# Patient Record
Sex: Male | Born: 1955 | Race: Black or African American | Hispanic: No | Marital: Married | Smoking: Former smoker
Health system: Southern US, Community
[De-identification: ages and names within clinical notes are randomized; demographics above are authoritative.]

## PROBLEM LIST (undated history)

## (undated) DIAGNOSIS — K219 Gastro-esophageal reflux disease without esophagitis: Secondary | ICD-10-CM

## (undated) DIAGNOSIS — E7800 Pure hypercholesterolemia, unspecified: Secondary | ICD-10-CM

## (undated) HISTORY — PX: HERNIA REPAIR: SHX51

## (undated) HISTORY — PX: ROTATOR CUFF REPAIR: SHX139

## (undated) HISTORY — PX: OTHER SURGICAL HISTORY: SHX169

---

## 2001-03-15 ENCOUNTER — Encounter: Payer: Self-pay | Admitting: Emergency Medicine

## 2001-03-15 ENCOUNTER — Emergency Department (HOSPITAL_COMMUNITY): Admission: EM | Admit: 2001-03-15 | Discharge: 2001-03-15 | Payer: Self-pay | Admitting: Emergency Medicine

## 2002-03-28 ENCOUNTER — Encounter: Payer: Self-pay | Admitting: Internal Medicine

## 2002-03-28 ENCOUNTER — Ambulatory Visit (HOSPITAL_COMMUNITY): Admission: RE | Admit: 2002-03-28 | Discharge: 2002-03-28 | Payer: Self-pay | Admitting: Internal Medicine

## 2003-02-20 ENCOUNTER — Encounter: Payer: Self-pay | Admitting: Emergency Medicine

## 2003-02-20 ENCOUNTER — Observation Stay (HOSPITAL_COMMUNITY): Admission: EM | Admit: 2003-02-20 | Discharge: 2003-02-21 | Payer: Self-pay | Admitting: Emergency Medicine

## 2004-03-21 ENCOUNTER — Emergency Department (HOSPITAL_COMMUNITY): Admission: EM | Admit: 2004-03-21 | Discharge: 2004-03-21 | Payer: Self-pay | Admitting: Emergency Medicine

## 2004-07-22 ENCOUNTER — Ambulatory Visit (HOSPITAL_COMMUNITY): Admission: RE | Admit: 2004-07-22 | Discharge: 2004-07-22 | Payer: Self-pay | Admitting: Internal Medicine

## 2004-08-05 ENCOUNTER — Ambulatory Visit (HOSPITAL_COMMUNITY): Admission: RE | Admit: 2004-08-05 | Discharge: 2004-08-05 | Payer: Self-pay | Admitting: Internal Medicine

## 2004-08-21 ENCOUNTER — Ambulatory Visit: Payer: Self-pay | Admitting: Orthopedic Surgery

## 2004-12-03 ENCOUNTER — Ambulatory Visit: Payer: Self-pay | Admitting: Orthopedic Surgery

## 2004-12-16 ENCOUNTER — Ambulatory Visit: Payer: Self-pay | Admitting: Orthopedic Surgery

## 2004-12-16 ENCOUNTER — Ambulatory Visit (HOSPITAL_COMMUNITY): Admission: RE | Admit: 2004-12-16 | Discharge: 2004-12-16 | Payer: Self-pay | Admitting: Orthopedic Surgery

## 2004-12-17 ENCOUNTER — Ambulatory Visit: Payer: Self-pay | Admitting: Orthopedic Surgery

## 2004-12-19 ENCOUNTER — Encounter (HOSPITAL_COMMUNITY): Admission: RE | Admit: 2004-12-19 | Discharge: 2005-01-18 | Payer: Self-pay | Admitting: Orthopedic Surgery

## 2004-12-24 ENCOUNTER — Ambulatory Visit: Payer: Self-pay | Admitting: Orthopedic Surgery

## 2004-12-25 ENCOUNTER — Emergency Department (HOSPITAL_COMMUNITY): Admission: EM | Admit: 2004-12-25 | Discharge: 2004-12-26 | Payer: Self-pay | Admitting: *Deleted

## 2005-01-01 ENCOUNTER — Ambulatory Visit: Payer: Self-pay | Admitting: Orthopedic Surgery

## 2005-01-19 ENCOUNTER — Encounter (HOSPITAL_COMMUNITY): Admission: RE | Admit: 2005-01-19 | Discharge: 2005-02-18 | Payer: Self-pay | Admitting: Orthopedic Surgery

## 2005-02-04 ENCOUNTER — Ambulatory Visit: Payer: Self-pay | Admitting: Orthopedic Surgery

## 2005-02-27 ENCOUNTER — Encounter (HOSPITAL_COMMUNITY): Admission: RE | Admit: 2005-02-27 | Discharge: 2005-03-29 | Payer: Self-pay | Admitting: Orthopedic Surgery

## 2005-03-01 ENCOUNTER — Emergency Department (HOSPITAL_COMMUNITY): Admission: EM | Admit: 2005-03-01 | Discharge: 2005-03-01 | Payer: Self-pay | Admitting: Emergency Medicine

## 2005-03-30 ENCOUNTER — Encounter (HOSPITAL_COMMUNITY): Admission: RE | Admit: 2005-03-30 | Discharge: 2005-04-29 | Payer: Self-pay | Admitting: Orthopedic Surgery

## 2005-03-30 ENCOUNTER — Ambulatory Visit: Payer: Self-pay | Admitting: Orthopedic Surgery

## 2005-07-23 ENCOUNTER — Ambulatory Visit: Payer: Self-pay | Admitting: Orthopedic Surgery

## 2005-09-11 ENCOUNTER — Emergency Department (HOSPITAL_COMMUNITY): Admission: EM | Admit: 2005-09-11 | Discharge: 2005-09-11 | Payer: Self-pay | Admitting: Emergency Medicine

## 2006-06-21 ENCOUNTER — Emergency Department (HOSPITAL_COMMUNITY): Admission: EM | Admit: 2006-06-21 | Discharge: 2006-06-21 | Payer: Self-pay | Admitting: Emergency Medicine

## 2007-01-25 ENCOUNTER — Ambulatory Visit: Payer: Self-pay | Admitting: Gastroenterology

## 2007-02-08 ENCOUNTER — Ambulatory Visit: Payer: Self-pay | Admitting: Gastroenterology

## 2007-02-08 ENCOUNTER — Ambulatory Visit (HOSPITAL_COMMUNITY): Admission: RE | Admit: 2007-02-08 | Discharge: 2007-02-08 | Payer: Self-pay | Admitting: Gastroenterology

## 2007-02-15 ENCOUNTER — Ambulatory Visit (HOSPITAL_COMMUNITY): Admission: RE | Admit: 2007-02-15 | Discharge: 2007-02-15 | Payer: Self-pay | Admitting: Internal Medicine

## 2007-12-05 ENCOUNTER — Ambulatory Visit: Payer: Self-pay | Admitting: Orthopedic Surgery

## 2007-12-05 DIAGNOSIS — M19019 Primary osteoarthritis, unspecified shoulder: Secondary | ICD-10-CM | POA: Insufficient documentation

## 2007-12-05 DIAGNOSIS — M25519 Pain in unspecified shoulder: Secondary | ICD-10-CM | POA: Insufficient documentation

## 2008-01-05 ENCOUNTER — Ambulatory Visit: Payer: Self-pay | Admitting: Orthopedic Surgery

## 2008-01-19 ENCOUNTER — Ambulatory Visit: Payer: Self-pay | Admitting: Orthopedic Surgery

## 2008-04-18 ENCOUNTER — Encounter: Payer: Self-pay | Admitting: Orthopedic Surgery

## 2008-04-23 ENCOUNTER — Ambulatory Visit: Payer: Self-pay | Admitting: Orthopedic Surgery

## 2008-05-02 ENCOUNTER — Encounter: Payer: Self-pay | Admitting: Orthopedic Surgery

## 2008-05-04 ENCOUNTER — Ambulatory Visit (HOSPITAL_COMMUNITY): Admission: RE | Admit: 2008-05-04 | Discharge: 2008-05-04 | Payer: Self-pay | Admitting: Orthopedic Surgery

## 2008-05-04 ENCOUNTER — Encounter: Payer: Self-pay | Admitting: Orthopedic Surgery

## 2008-05-04 ENCOUNTER — Ambulatory Visit: Payer: Self-pay | Admitting: Orthopedic Surgery

## 2008-05-07 ENCOUNTER — Ambulatory Visit: Payer: Self-pay | Admitting: Orthopedic Surgery

## 2008-05-09 ENCOUNTER — Encounter (HOSPITAL_COMMUNITY): Admission: RE | Admit: 2008-05-09 | Discharge: 2008-06-08 | Payer: Self-pay | Admitting: Orthopedic Surgery

## 2008-05-15 ENCOUNTER — Ambulatory Visit: Payer: Self-pay | Admitting: Orthopedic Surgery

## 2008-05-23 ENCOUNTER — Encounter: Payer: Self-pay | Admitting: Orthopedic Surgery

## 2008-06-07 ENCOUNTER — Encounter: Payer: Self-pay | Admitting: Orthopedic Surgery

## 2008-07-18 ENCOUNTER — Ambulatory Visit: Payer: Self-pay | Admitting: Orthopedic Surgery

## 2008-12-17 ENCOUNTER — Ambulatory Visit: Payer: Self-pay | Admitting: Orthopedic Surgery

## 2008-12-24 ENCOUNTER — Encounter: Payer: Self-pay | Admitting: Orthopedic Surgery

## 2008-12-24 ENCOUNTER — Ambulatory Visit (HOSPITAL_COMMUNITY): Admission: RE | Admit: 2008-12-24 | Discharge: 2008-12-24 | Payer: Self-pay | Admitting: Internal Medicine

## 2008-12-27 ENCOUNTER — Telehealth: Payer: Self-pay | Admitting: Orthopedic Surgery

## 2009-01-07 ENCOUNTER — Ambulatory Visit: Payer: Self-pay | Admitting: Orthopedic Surgery

## 2009-01-07 DIAGNOSIS — M75120 Complete rotator cuff tear or rupture of unspecified shoulder, not specified as traumatic: Secondary | ICD-10-CM

## 2009-01-09 ENCOUNTER — Encounter: Payer: Self-pay | Admitting: Orthopedic Surgery

## 2009-02-06 ENCOUNTER — Emergency Department (HOSPITAL_COMMUNITY): Admission: EM | Admit: 2009-02-06 | Discharge: 2009-02-06 | Payer: Self-pay | Admitting: Emergency Medicine

## 2009-02-13 ENCOUNTER — Encounter: Payer: Self-pay | Admitting: Orthopedic Surgery

## 2009-02-14 ENCOUNTER — Encounter: Payer: Self-pay | Admitting: Orthopedic Surgery

## 2009-02-15 ENCOUNTER — Ambulatory Visit: Payer: Self-pay | Admitting: Orthopedic Surgery

## 2009-02-15 ENCOUNTER — Ambulatory Visit (HOSPITAL_COMMUNITY): Admission: RE | Admit: 2009-02-15 | Discharge: 2009-02-15 | Payer: Self-pay | Admitting: Orthopedic Surgery

## 2009-02-19 ENCOUNTER — Ambulatory Visit: Payer: Self-pay | Admitting: Orthopedic Surgery

## 2009-02-20 ENCOUNTER — Encounter: Payer: Self-pay | Admitting: Orthopedic Surgery

## 2009-02-20 ENCOUNTER — Encounter (HOSPITAL_COMMUNITY): Admission: RE | Admit: 2009-02-20 | Discharge: 2009-03-27 | Payer: Self-pay | Admitting: Orthopedic Surgery

## 2009-02-25 ENCOUNTER — Ambulatory Visit: Payer: Self-pay | Admitting: Orthopedic Surgery

## 2009-02-26 ENCOUNTER — Ambulatory Visit: Payer: Self-pay | Admitting: Orthopedic Surgery

## 2009-02-28 ENCOUNTER — Encounter: Payer: Self-pay | Admitting: Orthopedic Surgery

## 2009-02-28 ENCOUNTER — Encounter (INDEPENDENT_AMBULATORY_CARE_PROVIDER_SITE_OTHER): Payer: Self-pay | Admitting: *Deleted

## 2009-03-14 ENCOUNTER — Encounter: Payer: Self-pay | Admitting: Orthopedic Surgery

## 2009-03-20 ENCOUNTER — Encounter: Payer: Self-pay | Admitting: Orthopedic Surgery

## 2009-03-27 ENCOUNTER — Encounter (INDEPENDENT_AMBULATORY_CARE_PROVIDER_SITE_OTHER): Payer: Self-pay | Admitting: *Deleted

## 2009-04-01 ENCOUNTER — Ambulatory Visit: Payer: Self-pay | Admitting: Orthopedic Surgery

## 2009-04-02 ENCOUNTER — Encounter (HOSPITAL_COMMUNITY): Admission: RE | Admit: 2009-04-02 | Discharge: 2009-05-02 | Payer: Self-pay | Admitting: Orthopedic Surgery

## 2009-04-03 ENCOUNTER — Encounter: Payer: Self-pay | Admitting: Orthopedic Surgery

## 2009-04-17 ENCOUNTER — Telehealth: Payer: Self-pay | Admitting: Orthopedic Surgery

## 2009-04-17 ENCOUNTER — Encounter: Payer: Self-pay | Admitting: Orthopedic Surgery

## 2009-04-26 ENCOUNTER — Encounter: Payer: Self-pay | Admitting: Orthopedic Surgery

## 2009-05-03 ENCOUNTER — Encounter (INDEPENDENT_AMBULATORY_CARE_PROVIDER_SITE_OTHER): Payer: Self-pay | Admitting: *Deleted

## 2009-05-06 ENCOUNTER — Encounter: Payer: Self-pay | Admitting: Orthopedic Surgery

## 2009-05-07 ENCOUNTER — Ambulatory Visit: Payer: Self-pay | Admitting: Orthopedic Surgery

## 2009-05-07 ENCOUNTER — Encounter (INDEPENDENT_AMBULATORY_CARE_PROVIDER_SITE_OTHER): Payer: Self-pay | Admitting: *Deleted

## 2009-05-15 ENCOUNTER — Encounter (INDEPENDENT_AMBULATORY_CARE_PROVIDER_SITE_OTHER): Payer: Self-pay | Admitting: *Deleted

## 2009-05-15 ENCOUNTER — Telehealth: Payer: Self-pay | Admitting: Orthopedic Surgery

## 2009-05-22 ENCOUNTER — Encounter: Payer: Self-pay | Admitting: Orthopedic Surgery

## 2009-05-27 ENCOUNTER — Encounter: Payer: Self-pay | Admitting: Orthopedic Surgery

## 2009-05-29 ENCOUNTER — Encounter: Payer: Self-pay | Admitting: Orthopedic Surgery

## 2009-06-03 ENCOUNTER — Telehealth: Payer: Self-pay | Admitting: Orthopedic Surgery

## 2009-06-08 ENCOUNTER — Emergency Department (HOSPITAL_COMMUNITY): Admission: EM | Admit: 2009-06-08 | Discharge: 2009-06-08 | Payer: Self-pay | Admitting: Emergency Medicine

## 2009-06-26 ENCOUNTER — Encounter: Payer: Self-pay | Admitting: Orthopedic Surgery

## 2009-07-25 ENCOUNTER — Telehealth: Payer: Self-pay | Admitting: Orthopedic Surgery

## 2009-07-30 ENCOUNTER — Ambulatory Visit: Payer: Self-pay | Admitting: Orthopedic Surgery

## 2009-08-05 ENCOUNTER — Encounter: Payer: Self-pay | Admitting: Orthopedic Surgery

## 2009-10-07 ENCOUNTER — Telehealth: Payer: Self-pay | Admitting: Orthopedic Surgery

## 2009-12-11 ENCOUNTER — Emergency Department (HOSPITAL_COMMUNITY): Admission: EM | Admit: 2009-12-11 | Discharge: 2009-12-11 | Payer: Self-pay | Admitting: Emergency Medicine

## 2009-12-16 ENCOUNTER — Telehealth: Payer: Self-pay | Admitting: Orthopedic Surgery

## 2010-05-07 ENCOUNTER — Encounter: Admission: RE | Admit: 2010-05-07 | Discharge: 2010-05-07 | Payer: Self-pay | Admitting: Urology

## 2010-07-14 ENCOUNTER — Ambulatory Visit (HOSPITAL_COMMUNITY): Admission: RE | Admit: 2010-07-14 | Discharge: 2010-07-14 | Payer: Self-pay | Admitting: Urology

## 2010-11-18 NOTE — Progress Notes (Signed)
Summary: Norco 5 request  Phone Note Call from Patient Call back at [facility] Phone [phone]   Summary of Call: wants refill on Norco 5, patient has not been seen since 07/30/09, and was told as needed, ok or not Initial call taken by: Ether Griffins,  December 16, 2009 11:03 AM  Follow-up for Phone Call        not Follow-up by: Fuller Canada MD,  December 16, 2009 11:58 AM

## 2011-01-01 LAB — BASIC METABOLIC PANEL
CO2: 27 mEq/L (ref 19–32)
Calcium: 9.2 mg/dL (ref 8.4–10.5)
Creatinine, Ser: 1.02 mg/dL (ref 0.4–1.5)
GFR calc non Af Amer: >60 mL/min (ref >60)

## 2011-01-01 LAB — HEMOGLOBIN AND HEMATOCRIT, BLOOD
HCT: 38.6 % — ABNORMAL LOW (ref 39.0–52.0)
Hemoglobin: 13.2 g/dL (ref 13.0–17.0)

## 2011-01-01 LAB — SURGICAL PCR SCREEN: MRSA, PCR: NEGATIVE

## 2011-01-24 LAB — BASIC METABOLIC PANEL
BUN: 11 mg/dL (ref 6–23)
CO2: 32 mEq/L (ref 19–32)
Calcium: 9.4 mg/dL (ref 8.4–10.5)
Chloride: 104 mEq/L (ref 96–112)
Creatinine, Ser: 0.97 mg/dL (ref 0.4–1.5)
GFR calc non Af Amer: >60 mL/min (ref >60)

## 2011-01-24 LAB — DIFFERENTIAL
Eosinophils Absolute: 0.2 10*3/uL (ref 0.0–0.7)
Eosinophils Relative: 5 % (ref 0–5)
Lymphocytes Relative: 34 % (ref 12–46)
Lymphs Abs: 1.5 10*3/uL (ref 0.7–4.0)
Monocytes Relative: 11 % (ref 3–12)
Neutro Abs: 2.2 10*3/uL (ref 1.7–7.7)
Neutrophils Relative %: 50 % (ref 43–77)

## 2011-01-24 LAB — CBC
HCT: 39.8 % (ref 39.0–52.0)
Hemoglobin: 13.7 g/dL (ref 13.0–17.0)
MCV: 86.5 fL (ref 78.0–100.0)
Platelets: 123 10*3/uL — ABNORMAL LOW (ref 150–400)
WBC: 4.4 10*3/uL (ref 4.0–10.5)

## 2011-01-28 LAB — BASIC METABOLIC PANEL
BUN: 12 mg/dL (ref 6–23)
Creatinine, Ser: 0.96 mg/dL (ref 0.4–1.5)
GFR calc Af Amer: >60 mL/min (ref >60)
GFR calc non Af Amer: >60 mL/min (ref >60)

## 2011-01-28 LAB — HEMOGLOBIN AND HEMATOCRIT, BLOOD
HCT: 39 % (ref 39.0–52.0)
Hemoglobin: 13.4 g/dL (ref 13.0–17.0)

## 2011-01-31 ENCOUNTER — Ambulatory Visit (HOSPITAL_COMMUNITY)
Admission: RE | Admit: 2011-01-31 | Payer: Managed Care, Other (non HMO) | Source: Ambulatory Visit | Admitting: Internal Medicine

## 2011-01-31 ENCOUNTER — Other Ambulatory Visit (HOSPITAL_COMMUNITY): Payer: Self-pay | Admitting: Internal Medicine

## 2011-01-31 ENCOUNTER — Ambulatory Visit (HOSPITAL_COMMUNITY)
Admission: RE | Admit: 2011-01-31 | Discharge: 2011-01-31 | Disposition: A | Discharge status: Home / Self Care | Payer: Managed Care, Other (non HMO) | Source: Ambulatory Visit | Attending: Internal Medicine | Admitting: Internal Medicine

## 2011-01-31 DIAGNOSIS — M25559 Pain in unspecified hip: Secondary | ICD-10-CM | POA: Insufficient documentation

## 2011-01-31 DIAGNOSIS — M545 Low back pain, unspecified: Secondary | ICD-10-CM | POA: Insufficient documentation

## 2011-01-31 DIAGNOSIS — M5380 Other specified dorsopathies, site unspecified: Secondary | ICD-10-CM | POA: Insufficient documentation

## 2011-01-31 DIAGNOSIS — M549 Dorsalgia, unspecified: Secondary | ICD-10-CM

## 2011-03-03 NOTE — Op Note (Signed)
NAMECAMP, GOPAL                 ACCOUNT NO.:  [id number]   MEDICAL RECORD NO.:  [id number]          PATIENT TYPE:  AMB   LOCATION:  DAY                           FACILITY:  [facility]   PHYSICIAN:  Vickki Hearing, M.D.DATE OF BIRTH:  1956-08-20   DATE OF PROCEDURE:  DATE OF DISCHARGE:                               OPERATIVE REPORT   Angel Powell is a 55 year old male with right shoulder pain status post open  Mumford in July 2009.  He did well but continued to have discomfort in  the right shoulder which progressed over time, it actually increased.  He was given an injection and pain medication and his symptoms did not  improve.  He went for an MRI which showed a subscapularis bursal-sided  partial-thickness tear, a full-thickness partial width supraspinatus  tendon tear, deformity of the humeral head consistent with a Hill-Sachs  lesion, and a glenoid flap tear at 3 o'clock position.   After discussion, he decided to have surgery on the shoulder for  arthroscopy with open mini rotator cuff repair.   PREOPERATIVE DIAGNOSIS:  Right rotator cuff tear.   POSTOPERATIVE DIAGNOSIS:  Right rotator cuff tear.   PROCEDURE:  Arthroscopy of the right shoulder, debridement of the  glenohumeral joint, debridement of the subscapularis tendon, and repair  of the right rotator cuff.  We used a 4.5 pushed lock anchor and two 5.0  corkscrew anchors all with FiberWire.   SURGEON:  Vickki Hearing, MD   ASSISTED BY:  Birdie Riddle.   ANESTHESIA:  General.   FINDINGS:  The subscapularis had a superior leading edge tear.  The  supraspinatus was torn.  The teres minor was torn.  The biceps had  tendonitis, and there was synovitis of the glenohumeral joint.  There  were no specimens.   The patient was identified in the preop area, site marking was  performed.  The patient was taken to surgery, given Ancef.  He had  general anesthesia.  He was placed in the modified beach-chair position.  He was  prepped with DuraPrep, draped sterilely, and time-out was  completed.   Posterior portal was established.  A diagnostic arthroscopy was  performed.  Through an anterior portal, we did a debridement of the  subscapularis and glenohumeral joint.  The biceps anchor was intact with  tendonitis.   The cuff tear was identified, and a scope was then placed in the  subacromial space.   An anterolateral portal was established.  The cuff was debrided and  mobilization procedures were performed to mobilize the cuff until it  reached the greater tuberosity.  The greater tuberosity was decorticated  with a bur.  No acromioplasty was performed as the patient had adequate  room in his subacromial space.   A lateral portal was established, and several sutures were using  FiberWire were passed through the cuff and brought out through the  anterolateral portal.   We then opened the shoulder through the anterolateral portal extending  the incision proximally and distally, given the deltoid muscle attached.  We did deltoid split, brought the sutures out through  the anterolateral  portal, and then passed the sutures into the PushLock and punched the  hole for the PushLock and advanced the PushLock down into bone which  secured the supraspinatus tendon.  We then took 2 suture anchors and  placed them in the greater tuberosity and repaired the posterior edge of  the supraspinatus as well as the teres minor.  We got an excellent  repair with no tension.  Wound was irrigated, closed in layered fashion  with 2-0 Monocryl, staples, and we injected a total 60 mL of Marcaine in  the subcu and subacromial space.  The patient was placed in a Cryo/Cuff  and with extubated, taken to recovery room in stable condition.   Postop plan is for immediate passive range of motion 0-90 in abduction  and flexion with a goal of 90 degrees by 2 weeks.  He will be discharged  home on the following medications:  1. Phenergan  25 mg,  2. Percocet 5 mg.  3. Robaxin 500 mg.   He will have 90 Percocet, 40 Phenergan, and 60 Robaxin.      Vickki Hearing, M.D.  Electronically Signed     SEH/MEDQ  D:  02/15/2009  T:  02/16/2009  Job:  [id number]

## 2011-03-03 NOTE — Op Note (Signed)
NAMELAREN, WHALING                 ACCOUNT NO.:  [id number]   MEDICAL RECORD NO.:  [id number]          PATIENT TYPE:  AMB   LOCATION:  DAY                           FACILITY:  [facility]   PHYSICIAN:  Vickki Hearing, M.D.DATE OF BIRTH:  25-Nov-1955   DATE OF PROCEDURE:  05/04/2008  DATE OF DISCHARGE:                               OPERATIVE REPORT   This is a 55 year old male with a history of right shoulder pain.  Radiographs showed a spur coming from the distal clavicle.  His pain  level was 8 out of 10, he had primarily pain reaching across his chest.  He denied radicular pain or neurologic symptoms.  His rotator cuff  clinically was intact and his impingement sign was unremarkable.   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   PROCEDURE:  Open Mumford/distal clavicle excision.   SURGEON:  Vickki Hearing, MD.   ANESTHETIC:  General.   OPERATIVE FINDINGS:  Degeneration of the [facility] joint with large impinging  undersurface spur.   SPECIMEN:  Distal clavicle.   ASSISTED BY:  Valetta Close.   DETAILS:  The patient was identified as Angel Powell.  His right arm was  marked for surgery and I countersigned it.  I reviewed his history and  physical, it was updated and the update was signed.  The patient was  given antibiotics, taken to the operating room for general anesthetic.  He was placed in a modified beach-chair position with a sandbag under  his shoulder his shoulder and arm were prepped with DuraPrep, draped  sterilely.  A time-out was completed.   A strap incision was made in Langer's line over the distal clavicle.  Subcutaneous tissue was divided, periosteum was split longitudinally  with the clavicle and subperiosteal dissection exposed the joint which  was degenerated.  The distal centimeter was removed and then the  posterior  edge was beveled back.  The arm was taken across the chest and  there was no further impinging of the collar bone with clavicle.  The  undersurface was smoothed with a rasp.  The wound was irrigated,  hemostasis was obtained.  Bone wax was applied to the distal end of the  clavicle and #0 Monocryl in two layers was used to close the skin along  with a running nylon.  We injected the subacromial space with 30 mL of  Marcaine with epinephrine, applied sterile dressings and Cryo/Cuff.  He  was extubated, taken to  recovery room in stable condition.  Postop plan is for the patient to  return to [place] next week to check the wound and make a referral for  physical therapy.  He is discharged on Lorcet +60 with two refills and  Phenergan 25 mg q.6 p.r.n. nausea with no refills.      Vickki Hearing, M.D.  Electronically Signed     SEH/MEDQ  D:  05/04/2008  T:  05/04/2008  Job:  [id number]

## 2011-03-06 NOTE — H&P (Signed)
NAME:  Angel Powell, Angel Powell                 ACCOUNT NO.:  [id number]   MEDICAL RECORD NO.:  [id number]          PATIENT TYPE:  AMB   LOCATION:  DAY                           FACILITY:  [facility]   PHYSICIAN:  Vickki Hearing, M.D.DATE OF BIRTH:  Apr 15, 1956   DATE OF ADMISSION:  DATE OF DISCHARGE:  LH                                HISTORY & PHYSICAL   CHIEF COMPLAINT:  Left shoulder pain.   HISTORY OF PRESENT ILLNESS:  This 55 year old male presented with pain in  the left shoulder, no known injury.  He had pain over a two-year period,  worse over the last six months.  It was associated with forward elevation,  and he remembered lifting weights and feeling a pop in his shoulder.  Although he got better, he still had pain with overhead activity.  He is an  avid weight lifter.  He describes aching, constant pain, with a history of  bench pressing trauma.  Modifying factors: overhead activity.  Related signs  or symptoms:  None.  MRI shows torn supraspinatus tendon with no retraction.   REVIEW OF SYSTEMS:  Positive systems ear, nose, throat, poor vision,  toothache, otherwise normal.   PHYSICAL EXAMINATION:  VITAL SIGNS:  Weight 250, pulse 76, respiratory rate  18.  GENERAL:  Muscular.  Normal development, grooming, hygiene..  No deformity.  CARDIOVASCULAR:  Normal pulses and perfusion.  CERVICAL SPINE:  No lymph nodes.  Gait and station were normal.  MUSCULOSKELETAL:  Left shoulder weakness was not present in the rotator  cuff.  There was full range of motion with pain with overhead activity,  positive impingement sign.  Shoulder was stable.  He was very muscular.  I  think his deltoid was compensating for the rotator cuff.  EXTREMITIES:  Lower extremities are well aligned without contracture,  dislocation, atrophy, or tremor.  Right upper extremity showed normal range  of motion, strength, and stability without swelling.  SKIN:  Normal.  NEUROPSYCHIATRY:  Exam showed normal sensation and  reflexes.  Psych exam  showed alert and oriented x 3 with pleasant mood.   IMPRESSION:  Again, his MRI does show a torn rotator cuff full-thickness  tear, no retraction, distal supraspinatus tendon.   PLAN:  Arthroscopy left shoulder medial rotator cuff repair.     SEH/MEDQ  D:  12/15/2004  T:  12/15/2004  Job:  [id number]

## 2011-03-06 NOTE — Procedures (Signed)
NAMEBERTRAN, ZEIMET                             ACCOUNT NO.:  [id number]   MEDICAL RECORD NO.:  [id number]                   PATIENT TYPE:   LOCATION:                                       FACILITY:   PHYSICIAN:  Kem Boroughs, M.D.                 DATE OF BIRTH:   DATE OF PROCEDURE:  02/21/2003  DATE OF DISCHARGE:                                  ECHOCARDIOGRAM   PROCEDURE:  Echocardiogram   SURGEON:  Sherral Hammers, M.D.   REFERRING PHYSICIAN:  Tesfaye D. Felecia Shelling, M.D. and Kem Boroughs, M.D.   INDICATIONS:  This patient is a 55 year old gentleman who is admitted with  chest pain.   TECHNICAL QUALITY:  The technical quality of this study was adequate.   FINDINGS:  1. The aorta is within normal limits at 4.0 cm.  2. The left atrium is within normal limits at 3.4 cm.  No obvious clots or     masses were appreciated.  The patient appeared to be in sinus rhythm     during this procedure.  3. The interventricular septum and posterior wall were thickened consistent     with mild-to-moderate concentric LVH.  4. The aortic valve appeared reasonably thin, trileaflet and pliable with     good leaflet excursion.  No aortic insufficiency was noted.  The proximal     aortic root was visualized and no obvious linear densities were noted.  5. The mitral valve was also reasonably thin and pliable with good leaflet     excursion.  No mitral valve prolapse was noted.  No mitral annular     calcification was noted.  Trivial mitral regurgitation was noted.  6. The pulmonic valve was incompletely visualized, but appeared grossly     structurally normal with mild pulmonic insufficiency noted.  7. The tricuspid valve was also grossly structurally normal.  8. The left ventricular was normal in size with the LVIBD measuring 4.3 cm     and the LVISD measured 3.0 cm.  Overall left ventricular systolic     function was normal and no regional wall motion abnormalities were noted.     There is no  evidence for diastolic dysfunction.  The right atrium and the     right ventricle are normal in size with normal right ventricular systolic     function.  No pericardial effusion is noted.   IMPRESSION:  1. Mild-to-moderate concentric left ventricular hypertrophy.  2. Normal left ventricular chamber size and systolic function.  3.     Trivial mitral regurgitation.  4. Mild pulmonic insufficiency.  5. No mitral valve prolapse.                                               Tori  Domingo Sep, M.D.    TB/MEDQ  D:  02/21/2003  T:  02/21/2003  Job:  [id number]   cc:   Tesfaye D. Felecia Shelling, M.D.  [address]  [place]  [place] [address]  Fax: [phone]

## 2011-03-06 NOTE — H&P (Signed)
NAME:  Angel Powell, Angel Powell                             ACCOUNT NO.:  [id number]   MEDICAL RECORD NO.:  [id number]                   PATIENT TYPE:  INP   LOCATION:  A202                                 FACILITY:  [facility]   PHYSICIAN:  Tesfaye D. Felecia Shelling, M.D.              DATE OF BIRTH:  [birth year]   DATE OF ADMISSION:  02/20/2003  DATE OF DISCHARGE:                                HISTORY & PHYSICAL   CHIEF COMPLAINT:  Chest pain.   HISTORY OF PRESENT ILLNESS:  This is a 55 year old male patient with history  of hypercholesterolemia. He came to the emergency room with complaint of  left-sided chest pain. The patient developed this chest pain suddenly which  was initially around his left shoulder blade.  Later he noticed pain on the  anterior part of his chest.  The pain was persistent.  There was no relief  with rest or change in position.  The patient took some over-the-counter  pain medicines.  However, the pain persisted.  He was then evaluated in the  emergency room where his initial cardiac enzymes were within normal limits.  The patient was begun on nitroglycerin sublingual.  There was no major  relief with nitroglycerin.  He was then admitted for further evaluation.   REVIEW OF SYSTEMS:  No headache, cough, shortness of breath, palpitation,  nausea, vomiting, or diaphoresis.  No dysuria, urgency, or frequency of  urination.   PAST MEDICAL HISTORY:  Hypercholesterolemia.   CURRENT MEDICATIONS:  The patient is not on any regular prescription  medications.   PERSONAL AND SOCIAL HISTORY:  The patient is married.  He has no history of  alcohol, tobacco, or substance abuse.   PHYSICAL EXAMINATION:  GENERAL:  The patient is alert, wake, and comfortable  with ________.  VITAL SIGNS:  Blood pressure 110/55, pulse 67, respiratory rate 20,  temperature 98 degrees F.  HEENT:  Pupils are equal and reactive.  NECK:  Supple.  CHEST:  Fair entry, clear lung fields.  CARDIOVASCULAR:  First and  second heart sounds are heard.  No murmur, no  gallop.  ABDOMEN:  Soft and lax.  Bowel sounds are positive.  No hepatosplenomegaly.  EXTREMITIES:  No leg edema.   LABORATORY DATA:  Labs on admission pH 7.412, pCO2 of 42, pO2 of 71.6,  saturation 94.  CBC: WBC 2.8, hemoglobin 14.3, hematocrit 42.7, and  platelets 149.  Blood chemistry:  Glucose 100, sodium 137, potassium 3.8,  chloride 103, carbon dioxide 27, BUN 14, creatinine 1.2, CPK 142, CK/MB 2.0,  and troponin 0.02.   ASSESSMENT:  This is a 55 year old male patient with a history of  cholesterolemia.  He came in with left-sided chest pain.  Even though his  chest pain is not typical of angina, in view of his hypercholesterolemia and  his age we have to rule out unstable angina.    PLAN:  1. We  will do EKG and cardiac enzymes x3.  2. We will do cardiology consult and we will do lipid profile and probably     start him on cholesterol medication.                                               Tesfaye D. Felecia Shelling, M.D.    TDF/MEDQ  D:  02/21/2003  T:  02/21/2003  Job:  [id number]

## 2011-03-06 NOTE — Consult Note (Signed)
NAME:  CHANDLER, SWIDERSKI                 ACCOUNT NO.:  [id number]   MEDICAL RECORD NO.:  [id number]          PATIENT TYPE:  AMB   LOCATION:                                FACILITY:  [facility]   PHYSICIAN:  Kassie Mends, M.D.      DATE OF BIRTH:  07-10-1956   DATE OF CONSULTATION:  01/25/2007  DATE OF DISCHARGE:                                 CONSULTATION   REASON FOR CONSULTATION:  Screening colonoscopy and abdominal pain.   HISTORY OF PRESENT ILLNESS:  Mr. Boardley is a 55 year old male who, when  questioned, complains of right flank pain.  He denies any blood in his  stool, black stool, or being positive on stool cards.  He denies any  problems swallowing, nausea, vomiting, or constipation.  He denies any  diarrhea, heartburn, indigestion, or abdominal pain.  He says his bowel  movements vary with what he eats.  He has a history of having a  colonoscopy at [facility] by Dr. Katrinka Blazing.  He denies any polyps or  problems with sedation.   PAST MEDICAL HISTORY:  Hyperlipidemia.   PAST SURGICAL HISTORY:  Rotator cuff repair.   ALLERGIES:  No known drug allergies.   MEDICATIONS:  Simvastatin 40 mg daily.   FAMILY HISTORY:  He denies any family history of colon cancer or colon  polyps.   SOCIAL HISTORY:  He has four children ages 101, 25, 90, and 2.  He is  employed.  He does not use tobacco or alcohol.   REVIEW OF SYSTEMS:  As per the HPI otherwise all systems are negative.   PHYSICAL EXAM:  VITAL SIGNS:  Weight 251 pounds, height 5 feet 9 inches,  temperature 92, blood pressure 112/78, pulse 64.  GENERAL:  He is in no apparent distress, alert and oriented x4.  HEENT:  Exam is atraumatic, normocephalic.  Pupils equal, react to light.  Mouth  no oral lesions.  Posterior pharynx without erythema or exudate.  LUNGS:  Clear to auscultation bilaterally.  CARDIOVASCULAR EXAM:  Regular  rhythm, no murmur, normal S1-S2.  ABDOMEN:  Bowel sounds are present.  Nontender, nondistended.  No rebound or  guarding.  No  hepatosplenomegaly, abdominal bruits, or pulsatile masses, obese.  EXTREMITIES:  Without cyanosis, clubbing or edema.  NEUROLOGIC:  He has  no focal neurologic deficits.   ASSESSMENT:  Mr. Dykstra is a 55 year old male who has right-sided pain,  but denies any abdominal pain.  He is average risk for developing colon  cancer.  Thank you for allowing me to see Mr. Vipond in consultation.  My  recommendations follow.   RECOMMENDATIONS:  1. Mr. Dewald will be scheduled for a colonoscopy with OsmoPrep.  He      understands that OsmoPrep may cause kidney problems if he does not      drink enough fluid.  He understands his urine should remain bright      yellow while taking the prep.  2. He may return to see me as needed.      Kassie Mends, M.D.  Electronically Signed  SM/MEDQ  D:  01/25/2007  T:  01/26/2007  Job:  [id number]   cc:   Tesfaye D. Felecia Shelling, MD  Fax: [phone]

## 2011-03-06 NOTE — Discharge Summary (Signed)
   NAME:  Angel Powell, Angel Powell                             ACCOUNT NO.:  [id number]   MEDICAL RECORD NO.:  [id number]                   PATIENT TYPE:  INP   LOCATION:  A202                                 FACILITY:  [facility]   PHYSICIAN:  Tesfaye D. Felecia Shelling, M.D.              DATE OF BIRTH:  [birth year]   DATE OF ADMISSION:  02/20/2003  DATE OF DISCHARGE:  02/21/2003                                 DISCHARGE SUMMARY   DISCHARGE DIAGNOSES:  1. Noncardiac chest pain.  2. Hyperlipidemia.   DISCHARGE MEDICATIONS:  1. Ibuprofen 800 mg 1 tablet p.o. t.i.d. p.r.n.  2. Aspirin 81 mg p.o. daily.   DISPOSITION:  The patient was discharged home in stable condition.   HOSPITAL COURSE:  This is a 55 year old male patient with history of  hypercholesterolemia.  He came to the emergency room due to chest pain.  The  pain was mainly on the left side, radiating to the back and right shoulder  area.  The patient had no diaphoresis, nausea, vomiting, or abdominal pain.  His pain was more when he tried to change position and take deep breaths.  The pain was not relieved by his nitroglycerin and was not related to  exertion.  The patient has a history of hyperlipidemia and was admitted to  rule out any cardiac source of chest pain.   The patient was admitted to telemetry.  Serial EKG and cardia exams were  done.  His enzymes were within normal limits.  The patient was evaluated by  cariology, and he was started on anti-inflammatory medication.  The patient  was discharged to home to follow on outpatient basis.                                               Tesfaye D. Felecia Shelling, M.D.    TDF/MEDQ  D:  03/13/2003  T:  03/13/2003  Job:  [id number]

## 2011-03-06 NOTE — Op Note (Signed)
NAME:  Angel Powell, Angel Powell                 ACCOUNT NO.:  [id number]   MEDICAL RECORD NO.:  [id number]          PATIENT TYPE:  AMB   LOCATION:  DAY                           FACILITY:  [facility]   PHYSICIAN:  Vickki Hearing, M.D.DATE OF BIRTH:  [birth year]   DATE OF PROCEDURE:  12/16/2004  DATE OF DISCHARGE:                                 OPERATIVE REPORT   PREOPERATIVE DIAGNOSIS:  Rotator cuff tear, left shoulder.   POSTOPERATIVE DIAGNOSIS:  Rotator cuff tear, left shoulder.   PROCEDURE:  Arthroscopy, left shoulder, mini open rotator cuff repair.   SURGEON:  Dr. Romeo Apple.   ASSISTANT:  No assistants.   ANESTHETIC:  General, positioned lateral with retraction.   OPERATIVE FINDINGS:  A 2-cm tear with 1 cm retraction, supraspinatus tendon,  partial thickness but greater than 50%   INDICATIONS FOR PROCEDURE:  Pain.   DESCRIPTION OF PROCEDURE:  The patient was identified as Angel Powell. His  left shoulder was marked as the surgical site and confirmed. History and  physical was updated. The surgeon's initials were placed over the surgical  site. He was given Ancef and taken to the operating room for general  anesthesia. He was placed in lateral decubitus position left side up.  Axillary roll in the right axilla, 10 pounds of traction placed on the left  upper extremity. Sterile prep and drape was performed, and a time-out was  taken. After the all the parameters of the time-out were met and confirmed,  the portal sites were injected with dilute lidocaine and epinephrine  solution. The scope was placed posteriorly. Diagnostic arthroscopy was  performed. An anterior portal was established. Debridement of the  glenohumeral joint was performed. See findings section 2.   The cuff was intact from the stem from the end from the glenohumeral joint  surface.   The scope was then placed in subacromial space. A bursectomy was performed.  There was no evidence of impingement; however, there was a  2-cm long tear  with 1 cm of retraction.   Through a lateral portal, the cuff was grabbed. It was very mobile. A suture  was passed as a holding suture and held with a clamp. A lateral incision was  made extending the lateral portal incision. Subcutaneous tissue was divided.  Deltoid was split. The cuff was grasped using a holding suture and advanced  from its original site of attachment, and two  #2 Ethibond sutures were  passed along with one suture anchor two FiberWires. A total of three sutures  were used to close the defect. The wound was irrigated. The deltoid split  was closed. The subcu tissue was closed using #1 Vicryl and 2-0 Monocryl. We  injected Marcaine, closed the skin with staples, closed the portals with  Prolene. We then placed the patient in a CryoCuff and sling. He was  extubated, taken to recovery room in stable condition.   FINDINGS SECTION TWO:  Glenohumeral joint:  There was synovitis and fraying  of the labrum anteriorly.   In the subacromial space, there was bursitis with 2-cm long, 1-cm retracted  supraspinatus  tendon tear.      SEH/MEDQ  D:  12/16/2004  T:  12/16/2004  Job:  [id number]

## 2011-03-06 NOTE — Op Note (Signed)
NAMEDEDDRICK, SAINDON                 ACCOUNT NO.:  [id number]   MEDICAL RECORD NO.:  [id number]          PATIENT TYPE:  AMB   LOCATION:  DAY                           FACILITY:  [facility]   PHYSICIAN:  Kassie Mends, M.D.      DATE OF BIRTH:  Nov 30, 1955   DATE OF PROCEDURE:  02/08/2007  DATE OF DISCHARGE:  02/08/2007                               OPERATIVE REPORT   PROCEDURE:  Colonoscopy.   INDICATIONS FOR EXAM:  Mr. Mcminn is a 55 year old male who presents with  right flank pain.  He also needs average risk colon cancer screening.   FINDINGS:  Small internal hemorrhoids.  Otherwise, normal colon without  evidence of polyps, masses, inflammatory changes, diverticula, or AVM.   RECOMMENDATIONS:  1. High fiber diet, he was given a hand out on high fiber diet.  2. Screening colonoscopy in ten years.  3. Consider follow up with Dr. Felecia Shelling if his right flank pain      continues.   PROCEDURE TECHNIQUE:  Physical exam was performed and informed consent  was obtained from the patient after explaining the benefits, risks, and  alternatives to the procedure.  Continuous oxygen was provided by nasal  cannula and IV medicines administered through an indwelling cannula.  After administration of sedation and rectal exam, the patient's rectum  was intubated.  The scope was passed under direct visualization to the  cecum.  The scope was subsequently removed slowly by carefully examining  the color, texture, anatomy, and integrity of the mucosa on the way out.  The patient was recovered in the endoscopy suite and discharged home in  satisfactory condition.      Kassie Mends, M.D.  Electronically Signed     SM/MEDQ  D:  02/10/2007  T:  02/10/2007  Job:  [id number]   cc:   Tesfaye D. Felecia Shelling, MD  Fax: [phone]

## 2011-03-10 ENCOUNTER — Other Ambulatory Visit (HOSPITAL_COMMUNITY): Payer: Self-pay | Admitting: Neurosurgery

## 2011-03-10 DIAGNOSIS — Q675 Congenital deformity of spine: Secondary | ICD-10-CM

## 2011-03-13 ENCOUNTER — Ambulatory Visit (HOSPITAL_COMMUNITY): Payer: Managed Care, Other (non HMO)

## 2011-03-13 ENCOUNTER — Other Ambulatory Visit (HOSPITAL_COMMUNITY): Payer: Managed Care, Other (non HMO)

## 2011-03-13 ENCOUNTER — Ambulatory Visit (HOSPITAL_COMMUNITY)
Admission: RE | Admit: 2011-03-13 | Discharge: 2011-03-13 | Disposition: A | Discharge status: Home / Self Care | Payer: Managed Care, Other (non HMO) | Source: Ambulatory Visit | Attending: Neurosurgery | Admitting: Neurosurgery

## 2011-03-13 DIAGNOSIS — M79609 Pain in unspecified limb: Secondary | ICD-10-CM | POA: Insufficient documentation

## 2011-03-13 DIAGNOSIS — M545 Low back pain, unspecified: Secondary | ICD-10-CM | POA: Insufficient documentation

## 2011-03-13 DIAGNOSIS — M5126 Other intervertebral disc displacement, lumbar region: Secondary | ICD-10-CM | POA: Insufficient documentation

## 2011-03-13 DIAGNOSIS — Q675 Congenital deformity of spine: Secondary | ICD-10-CM

## 2011-03-13 DIAGNOSIS — M25559 Pain in unspecified hip: Secondary | ICD-10-CM | POA: Insufficient documentation

## 2011-06-15 ENCOUNTER — Ambulatory Visit (HOSPITAL_COMMUNITY)
Admission: RE | Admit: 2011-06-15 | Discharge: 2011-06-15 | Disposition: A | Discharge status: Home / Self Care | Payer: Managed Care, Other (non HMO) | Source: Ambulatory Visit | Attending: Internal Medicine | Admitting: Internal Medicine

## 2011-06-15 ENCOUNTER — Other Ambulatory Visit (HOSPITAL_COMMUNITY): Payer: Self-pay | Admitting: Internal Medicine

## 2011-06-15 DIAGNOSIS — R5381 Other malaise: Secondary | ICD-10-CM | POA: Insufficient documentation

## 2011-06-15 DIAGNOSIS — R634 Abnormal weight loss: Secondary | ICD-10-CM | POA: Insufficient documentation

## 2011-06-15 DIAGNOSIS — R5383 Other fatigue: Secondary | ICD-10-CM | POA: Insufficient documentation

## 2011-07-07 ENCOUNTER — Emergency Department (HOSPITAL_COMMUNITY)
Admission: EM | Admit: 2011-07-07 | Discharge: 2011-07-07 | Disposition: A | Discharge status: Home / Self Care | Payer: Managed Care, Other (non HMO) | Attending: Emergency Medicine | Admitting: Emergency Medicine

## 2011-07-07 ENCOUNTER — Encounter (HOSPITAL_COMMUNITY): Payer: Self-pay | Admitting: Emergency Medicine

## 2011-07-07 ENCOUNTER — Encounter: Payer: Self-pay | Admitting: Emergency Medicine

## 2011-07-07 DIAGNOSIS — M25519 Pain in unspecified shoulder: Secondary | ICD-10-CM | POA: Insufficient documentation

## 2011-07-07 DIAGNOSIS — E7800 Pure hypercholesterolemia, unspecified: Secondary | ICD-10-CM | POA: Insufficient documentation

## 2011-07-07 DIAGNOSIS — M25512 Pain in left shoulder: Secondary | ICD-10-CM

## 2011-07-07 DIAGNOSIS — Z5320 Procedure and treatment not carried out because of patient's decision for unspecified reasons: Secondary | ICD-10-CM | POA: Insufficient documentation

## 2011-07-07 HISTORY — DX: Pure hypercholesterolemia, unspecified: E78.00

## 2011-07-07 MED ORDER — TRAMADOL HCL 50 MG PO TABS
ORAL_TABLET | ORAL | Status: DC
Start: 2011-07-07 — End: 2012-01-02

## 2011-07-07 NOTE — ED Notes (Signed)
Pt c/o left shoulder pain since yesterday. 

## 2011-07-07 NOTE — ED Notes (Signed)
C/o left shoulder pain onset yesterday while at work; pt has hx of rotator cuff injury and repair to same shoulder; states was sent to [facility] in [place] yesterday; states xrays were obtained and were negative; states was instructed to take ibuprofen 800mg  tid; reports this is not helping; states has not rec'd f/u instructions from his workplace yet.

## 2011-07-07 NOTE — ED Provider Notes (Addendum)
History     CSN: [id number] Arrival date & time: 07/07/2011  4:10 PM   Chief Complaint  Patient presents with  . Shoulder Pain     (Include location/radiation/quality/duration/timing/severity/associated sxs/prior treatment) HPI Comments: Patient c/o pain to the left shoulder that began after he injured it during work yesterday.  States that he was seen by occupational health and had x-ray preformed and is waiting for referral to an orthopedist.  Comes to Ed today requesting pain medication , states ibuprofen is not helping.  He also states that he had a prior rotator cuff injury with surgical repair and pain this time feel similar to previous injury.  He denies numbness, weakness , chest pain or neck pain  Patient is a 55 y.o. male presenting with shoulder pain. The history is provided by the patient.  Shoulder Pain This is a recurrent problem. The current episode started yesterday. The problem occurs constantly. The problem has been gradually improving. Associated symptoms include arthralgias. Pertinent negatives include no fever, headaches, joint swelling, neck pain, numbness or weakness. Exacerbated by: movement. He has tried nothing for the symptoms. The treatment provided no relief.     Past Medical History  Diagnosis Date  . Hypercholesteremia      Past Surgical History  Procedure Date  . Rotator cuff repair   . Bone spur     History reviewed. No pertinent family history.  History  Substance Use Topics  . Smoking status: Not on file  . Smokeless tobacco: Not on file  . Alcohol Use: Yes     occassional      Review of Systems  Constitutional: Negative for fever.  HENT: Negative for neck pain and neck stiffness.   Eyes: Negative for visual disturbance.  Musculoskeletal: Positive for arthralgias. Negative for back pain, joint swelling and gait problem.  Neurological: Negative for dizziness, weakness, numbness and headaches.  All other systems reviewed and are  negative.    Allergies  Review of patient's allergies indicates no known allergies.  Home Medications   Current Outpatient Rx  Name Route Sig Dispense Refill  . OMEGA-3 FATTY ACIDS 1000 MG PO CAPS Oral Take 3 g by mouth daily.      Marland Kitchen GABAPENTIN 100 MG PO CAPS Oral Take 300 mg by mouth 3 (three) times daily.      Carma Leaven M PLUS PO TABS Oral Take 1 tablet by mouth daily.      Marland Kitchen SIMVASTATIN 40 MG PO TABS Oral Take 40 mg by mouth daily.        Physical Exam    BP 129/87  Pulse 81  Temp(Src) 98.7 F (37.1 C) (Oral)  Resp 17  Ht 5\' 9"  (1.753 m)  Wt 217 lb (98.431 kg)  BMI 32.05 kg/m2  SpO2 100%  Physical Exam  Nursing note and vitals reviewed. Constitutional: He is oriented to person, place, and time. He appears well-developed and well-nourished. No distress.  HENT:  Head: Normocephalic.  Mouth/Throat: Oropharynx is clear and moist.  Eyes: EOM are normal. Pupils are equal, round, and reactive to light.  Neck: Normal range of motion. Neck supple.  Cardiovascular: Normal rate, regular rhythm and normal heart sounds.   Pulmonary/Chest: Effort normal and breath sounds normal.  Musculoskeletal: He exhibits tenderness. He exhibits no edema.       Right shoulder: He exhibits normal range of motion, no tenderness, no bony tenderness, no swelling, no effusion, no crepitus, no deformity, no laceration, normal pulse and normal strength.  Left shoulder: He exhibits tenderness and pain. He exhibits normal range of motion, no bony tenderness, no swelling, no deformity, no laceration, normal pulse and normal strength.  Neurological: He is alert and oriented to person, place, and time. He has normal reflexes. He displays normal reflexes. No cranial nerve deficit. He exhibits normal muscle tone.  Skin: Skin is warm and dry.    ED Course  Procedures       MDM  4:55 PM ttp of the anterior left shoulder.  Pt has full ROM of the joint.  No focal weakness, sensation intact.  CR<2  sec.  Pt was triaged this am for same complaint but left before being seen.  No cervical tenderness on exam.  Previous ED chart and vitals were reviewed by me.    Patient / Family / Caregiver understand and agree with initial ED impression and plan with expectations set for ED visit.         Whitnie Deleon L. Akiba Melfi, PA 07/12/11 1612  Heleena Miceli L. Vergene Marland, [place] 07/20/11 1441

## 2011-07-07 NOTE — ED Notes (Signed)
Pt c/o left shoulder pain since yesterday.

## 2011-07-14 NOTE — ED Provider Notes (Signed)
Medical screening examination/treatment/procedure(s) were performed by non-physician practitioner and as supervising physician I was immediately available for consultation/collaboration.   Lanika Colgate R Betina Puckett, MD 07/14/11 1122 

## 2011-07-16 LAB — BASIC METABOLIC PANEL
Chloride: 106
Creatinine, Ser: 1.09
GFR calc non Af Amer: >60

## 2011-07-22 NOTE — ED Provider Notes (Signed)
Medical screening examination/treatment/procedure(s) were performed by non-physician practitioner and as supervising physician I was immediately available for consultation/collaboration.  Celene Kras, MD 07/22/11 1520

## 2012-01-02 ENCOUNTER — Encounter (HOSPITAL_COMMUNITY): Payer: Self-pay

## 2012-01-02 ENCOUNTER — Emergency Department (HOSPITAL_COMMUNITY)
Admission: EM | Admit: 2012-01-02 | Discharge: 2012-01-02 | Disposition: A | Discharge status: Home / Self Care | Payer: Managed Care, Other (non HMO) | Attending: Emergency Medicine | Admitting: Emergency Medicine

## 2012-01-02 DIAGNOSIS — Z79899 Other long term (current) drug therapy: Secondary | ICD-10-CM | POA: Insufficient documentation

## 2012-01-02 DIAGNOSIS — R42 Dizziness and giddiness: Secondary | ICD-10-CM | POA: Insufficient documentation

## 2012-01-02 DIAGNOSIS — R1915 Other abnormal bowel sounds: Secondary | ICD-10-CM | POA: Insufficient documentation

## 2012-01-02 DIAGNOSIS — N390 Urinary tract infection, site not specified: Secondary | ICD-10-CM | POA: Insufficient documentation

## 2012-01-02 DIAGNOSIS — R110 Nausea: Secondary | ICD-10-CM | POA: Insufficient documentation

## 2012-01-02 DIAGNOSIS — R109 Unspecified abdominal pain: Secondary | ICD-10-CM | POA: Insufficient documentation

## 2012-01-02 DIAGNOSIS — R197 Diarrhea, unspecified: Secondary | ICD-10-CM | POA: Insufficient documentation

## 2012-01-02 DIAGNOSIS — E7800 Pure hypercholesterolemia, unspecified: Secondary | ICD-10-CM | POA: Insufficient documentation

## 2012-01-02 LAB — URINALYSIS, ROUTINE W REFLEX MICROSCOPIC
Bilirubin Urine: NEGATIVE
Ketones, ur: NEGATIVE mg/dL
Nitrite: NEGATIVE
Protein, ur: NEGATIVE mg/dL
Urobilinogen, UA: 0.2 mg/dL (ref 0.0–1.0)

## 2012-01-02 LAB — DIFFERENTIAL
Basophils Absolute: 0 10*3/uL (ref 0.0–0.1)
Basophils Relative: 1 % (ref 0–1)
Eosinophils Absolute: 0.1 10*3/uL (ref 0.0–0.7)
Monocytes Absolute: 0.6 10*3/uL (ref 0.1–1.0)
Monocytes Relative: 14 % — ABNORMAL HIGH (ref 3–12)

## 2012-01-02 LAB — BASIC METABOLIC PANEL
BUN: 13 mg/dL (ref 6–23)
Calcium: 9.5 mg/dL (ref 8.4–10.5)
Creatinine, Ser: 1.01 mg/dL (ref 0.50–1.35)
GFR calc Af Amer: >90 mL/min (ref >90)
GFR calc non Af Amer: 82 mL/min — ABNORMAL LOW (ref >90)

## 2012-01-02 LAB — CBC
HCT: 41.7 % (ref 39.0–52.0)
Hemoglobin: 13.9 g/dL (ref 13.0–17.0)
MCH: 29.3 pg (ref 26.0–34.0)
MCHC: 33.3 g/dL (ref 30.0–36.0)
RDW: 15.5 % (ref 11.5–15.5)

## 2012-01-02 LAB — URINE MICROSCOPIC-ADD ON

## 2012-01-02 MED ORDER — CIPROFLOXACIN HCL 250 MG PO TABS
500.0000 mg | ORAL_TABLET | Freq: Once | ORAL | Status: AC
  Administered 2012-01-02: 500 mg via ORAL
  Filled 2012-01-02: qty 2

## 2012-01-02 MED ORDER — LOPERAMIDE HCL 2 MG PO CAPS
4.0000 mg | ORAL_CAPSULE | Freq: Once | ORAL | Status: AC
  Administered 2012-01-02: 4 mg via ORAL
  Filled 2012-01-02: qty 2

## 2012-01-02 MED ORDER — CIPROFLOXACIN HCL 500 MG PO TABS: 500.0000 mg | ORAL_TABLET | Freq: Two times a day (BID) | ORAL | Status: AC

## 2012-01-02 MED ORDER — ONDANSETRON HCL 4 MG/2ML IJ SOLN
4.0000 mg | Freq: Once | INTRAMUSCULAR | Status: AC
  Administered 2012-01-02: 4 mg via INTRAVENOUS
  Filled 2012-01-02: qty 2

## 2012-01-02 NOTE — ED Notes (Signed)
Complain of diarrhea since Thursday. Denies other symptoms

## 2012-01-02 NOTE — ED Provider Notes (Signed)
History     CSN: [id number]  Arrival date & time 01/02/12  7829   First MD Initiated Contact with Patient 01/02/12 [phone]      Chief Complaint  Patient presents with  . Diarrhea    (Consider location/radiation/quality/duration/timing/severity/associated sxs/prior treatment) Patient is a 56 y.o. male presenting with diarrhea. The history is provided by the patient. A language interpreter was used.  Diarrhea The primary symptoms include abdominal pain, nausea and diarrhea. Primary symptoms do not include fever, vomiting or dysuria. The illness began yesterday. The onset was gradual. The problem has not changed since onset. The illness does not include chills or constipation.    Past Medical History  Diagnosis Date  . Hypercholesteremia     Past Surgical History  Procedure Date  . Rotator cuff repair   . Bone spur     No family history on file.  History  Substance Use Topics  . Smoking status: Not on file  . Smokeless tobacco: Not on file  . Alcohol Use: Yes     occassional      Review of Systems  Constitutional: Negative for fever and chills.  Gastrointestinal: Positive for nausea, abdominal pain and diarrhea. Negative for vomiting, constipation, blood in stool and anal bleeding.       Crampy abdominal pain  Genitourinary: Positive for decreased urine volume. Negative for dysuria and flank pain.  Neurological: Positive for light-headedness.  All other systems reviewed and are negative.    Allergies  Review of patient's allergies indicates no known allergies.  Home Medications   Current Outpatient Rx  Name Route Sig Dispense Refill  . OMEGA-3 FATTY ACIDS 1000 MG PO CAPS Oral Take 3 g by mouth daily.      Carma Leaven M PLUS PO TABS Oral Take 1 tablet by mouth daily.      Marland Kitchen SIMVASTATIN 40 MG PO TABS Oral Take 40 mg by mouth daily.        BP 112/84  Pulse 91  Temp(Src) 98.5 F (36.9 C) (Oral)  Resp 20  Ht 5\' 10"  (1.778 m)  Wt 220 lb (99.791 kg)  BMI 31.57  kg/m2  SpO2 99%  Physical Exam  Nursing note and vitals reviewed. Constitutional: He is oriented to person, place, and time. He appears well-developed and well-nourished.  HENT:  Head: Normocephalic and atraumatic.  Right Ear: External ear normal.  Left Ear: External ear normal.  Nose: Nose normal.  Mouth/Throat: Oropharynx is clear and moist. No oropharyngeal exudate.  Eyes: EOM are normal.  Neck: Normal range of motion.  Cardiovascular: Normal rate, regular rhythm, normal heart sounds and intact distal pulses.   Pulmonary/Chest: Effort normal and breath sounds normal. No respiratory distress.  Abdominal: Soft. Normal appearance. He exhibits no distension, no pulsatile liver, no fluid wave, no abdominal bruit and no mass. Bowel sounds are increased. There is no hepatosplenomegaly. There is no tenderness. There is no rebound, no guarding and no CVA tenderness.  Musculoskeletal: Normal range of motion.  Neurological: He is alert and oriented to person, place, and time. Coordination normal.  Skin: Skin is warm and dry.  Psychiatric: He has a normal mood and affect. His behavior is normal. Judgment and thought content normal.    ED Course  Procedures (including critical care time)  Labs Reviewed  CBC - Abnormal; Notable for the following:    Platelets 115 (*)    All other components within normal limits  DIFFERENTIAL - Abnormal; Notable for the following:    Monocytes  Relative 14 (*)    All other components within normal limits  BASIC METABOLIC PANEL - Abnormal; Notable for the following:    GFR calc non Af Amer 82 (*)    All other components within normal limits  URINALYSIS, ROUTINE W REFLEX MICROSCOPIC - Abnormal; Notable for the following:    APPearance CLOUDY (*)    Specific Gravity, Urine >1.030 (*)    Hgb urine dipstick TRACE (*)    Leukocytes, UA SMALL (*)    All other components within normal limits  URINE MICROSCOPIC-ADD ON - Abnormal; Notable for the following:     Bacteria, UA FEW (*)    All other components within normal limits  URINE CULTURE   No results found.   1. Diarrhea   2. UTI (urinary tract infection)       MDM  rx cipro OTC imodium Sip fluids.  F/u with your PCP        Worthy Rancher, PA 01/02/12 1325  Worthy Rancher, PA 01/02/12 1332

## 2012-01-02 NOTE — ED Provider Notes (Signed)
Medical screening examination/treatment/procedure(s) were performed by non-physician practitioner and as supervising physician I was immediately available for consultation/collaboration.   Benny Lennert, MD 01/02/12 [phone]

## 2012-01-02 NOTE — ED Notes (Signed)
Crackers, water given

## 2012-01-02 NOTE — Discharge Instructions (Signed)
Urinary Tract Infection Infections of the urinary tract can start in several places. A bladder infection (cystitis), a kidney infection (pyelonephritis), and a prostate infection (prostatitis) are different types of urinary tract infections (UTIs). They usually get better if treated with medicines (antibiotics) that kill germs. Take all the medicine until it is gone. You or your child may feel better in a few days, but TAKE ALL MEDICINE or the infection may not respond and may become more difficult to treat. HOME CARE INSTRUCTIONS   Drink enough water and fluids to keep the urine clear or pale yellow. Cranberry juice is especially recommended, in addition to large amounts of water.   Avoid caffeine, tea, and carbonated beverages. They tend to irritate the bladder.   Alcohol may irritate the prostate.   Only take over-the-counter or prescription medicines for pain, discomfort, or fever as directed by your caregiver.  To prevent further infections:  Empty the bladder often. Avoid holding urine for long periods of time.   After a bowel movement, women should cleanse from front to back. Use each tissue only once.   Empty the bladder before and after sexual intercourse.  FINDING OUT THE RESULTS OF YOUR TEST Not all test results are available during your visit. If your or your child's test results are not back during the visit, make an appointment with your caregiver to find out the results. Do not assume everything is normal if you have not heard from your caregiver or the medical facility. It is important for you to follow up on all test results. SEEK MEDICAL CARE IF:   There is back pain.   Your baby is older than 3 months with a rectal temperature of 100.5 F (38.1 C) or higher for more than 1 day.   Your or your child's problems (symptoms) are no better in 3 days. Return sooner if you or your child is getting worse.  SEEK IMMEDIATE MEDICAL CARE IF:   There is severe back pain or lower  abdominal pain.   You or your child develops chills.   You have a fever.   Your baby is older than 3 months with a rectal temperature of 102 F (38.9 C) or higher.   Your baby is 42 months old or younger with a rectal temperature of 100.4 F (38 C) or higher.   There is nausea or vomiting.   There is continued burning or discomfort with urination.  MAKE SURE YOU:   Understand these instructions.   Will watch your condition.   Will get help right away if you are not doing well or get worse.  Document Released: 07/15/2005 Document Revised: 09/24/2011 Document Reviewed: 02/17/2007 [facility] Patient Information 2012 [place], [place].Diarrhea Infections caused by germs (bacterial) or a virus commonly cause diarrhea. Your caregiver has determined that with time, rest and fluids, the diarrhea should improve. In general, eat normally while drinking more water than usual. Although water may prevent dehydration, it does not contain salt and minerals (electrolytes). Broths, weak tea without caffeine and oral rehydration solutions (ORS) replace fluids and electrolytes. Small amounts of fluids should be taken frequently. Large amounts at one time may not be tolerated. Plain water may be harmful in infants and the elderly. Oral rehydrating solutions (ORS) are available at pharmacies and grocery stores. ORS replace water and important electrolytes in proper proportions. Sports drinks are not as effective as ORS and may be harmful due to sugars worsening diarrhea.  ORS is especially recommended for use in children with  diarrhea. As a general guideline for children, replace any new fluid losses from diarrhea and/or vomiting with ORS as follows:   If your child weighs 22 pounds or under (10 kg or less), give 60-120 mL ( -  cup or 2 - 4 ounces) of ORS for each episode of diarrheal stool or vomiting episode.   If your child weighs more than 22 pounds (more than 10 kgs), give 120-240 mL ( - 1 cup or 4 - 8  ounces) of ORS for each diarrheal stool or episode of vomiting.   While correcting for dehydration, children should eat normally. However, foods high in sugar should be avoided because this may worsen diarrhea. Large amounts of carbonated soft drinks, juice, gelatin desserts and other highly sugared drinks should be avoided.   After correction of dehydration, other liquids that are appealing to the child may be added. Children should drink small amounts of fluids frequently and fluids should be increased as tolerated. Children should drink enough fluids to keep urine clear or pale yellow.   Adults should eat normally while drinking more fluids than usual. Drink small amounts of fluids frequently and increase as tolerated. Drink enough fluids to keep urine clear or pale yellow. Broths, weak decaffeinated tea, lemon lime soft drinks (allowed to go flat) and ORS replace fluids and electrolytes.   Avoid:   Carbonated drinks.   Juice.   Extremely hot or cold fluids.   Caffeine drinks.   Fatty, greasy foods.   Alcohol.   Tobacco.   Too much intake of anything at one time.   Gelatin desserts.   Probiotics are active cultures of beneficial bacteria. They may lessen the amount and number of diarrheal stools in adults. Probiotics can be found in yogurt with active cultures and in supplements.   Wash hands well to avoid spreading bacteria and virus.   Anti-diarrheal medications are not recommended for infants and children.   Only take over-the-counter or prescription medicines for pain, discomfort or fever as directed by your caregiver. Do not give aspirin to children because it may cause Reye's Syndrome.   For adults, ask your caregiver if you should continue all prescribed and over-the-counter medicines.   If your caregiver has given you a follow-up appointment, it is very important to keep that appointment. Not keeping the appointment could result in a chronic or permanent injury, and  disability. If there is any problem keeping the appointment, you must call back to this facility for assistance.  SEEK IMMEDIATE MEDICAL CARE IF:   You or your child is unable to keep fluids down or other symptoms or problems become worse in spite of treatment.   Vomiting or diarrhea develops and becomes persistent.   There is vomiting of blood or bile (green material).   There is blood in the stool or the stools are black and tarry.   There is no urine output in 6-8 hours or there is only a small amount of very dark urine.   Abdominal pain develops, increases or localizes.   You have a fever.   Your baby is older than 3 months with a rectal temperature of 102 F (38.9 C) or higher.   Your baby is 36 months old or younger with a rectal temperature of 100.4 F (38 C) or higher.   You or your child develops excessive weakness, dizziness, fainting or extreme thirst.   You or your child develops a rash, stiff neck, severe headache or become irritable or sleepy and difficult  to awaken.  MAKE SURE YOU:   Understand these instructions.   Will watch your condition.   Will get help right away if you are not doing well or get worse.  Document Released: 09/25/2002 Document Revised: 09/24/2011 Document Reviewed: 08/12/2009 [facility] Patient Information 2012 [place], [place].   It appears that you have a urinary tract infection.  Take the cipro as directed.  Take the imodium per package instructions.  Increase fluid intake.    Call dr. Felecia Shelling on Monday for follow up.  He may also want you to see a urologist.

## 2012-01-03 LAB — URINE CULTURE
Colony Count: NO GROWTH
Culture  Setup Time: 201303162006
Culture: NO GROWTH

## 2012-06-09 NOTE — H&P (Signed)
  NTS SOAP Note  Vital Signs:  Vitals as of: 06/09/2012: Systolic 142: Diastolic 91: Heart Rate 73: Temp 98.52F: Height 60ft 10in: Weight 213Lbs 0 Ounces: BMI 31  BMI : 30.56 kg/m2  Subjective: This 56 Years 0 Months old Male presents for of a cyst on the back of the scalp.  Has been present for one year, but is becoming more tender.  No drainage noted.  Review of Symptoms:  GMW:[id number]   Head:unremarkable    Eyes:unremarkable   Nose/Mouth/Throat:unremarkable Cardiovascular:  unremarkable   Respiratory:unremarkable   Gastrointestinal:  unremarkable   Genitourinary:unremarkable     Musculoskeletal:unremarkable   Hematolgic/Lymphatic:unremarkable     Allergic/Immunologic:unremarkable     Past Medical History:    Reviewed   Past Medical History  Surgical History: rotator cuff surgery Medical Problems:  High cholesterol Allergies: nkda Medications: simvastatin   Social History:Reviewed  Social History  Preferred Language: English ([place]) Race:  Black or African American Ethnicity: Not Hispanic / Latino Age: 56 Years 0 Months Marital Status:  M Alcohol: yes Recreational drug(s):  No   Smoking Status: Current every day smoker reviewed on 06/09/2012 Started Date: 10/20/1975 Packs per day: 0.50   Family History:  Reviewed   Family History  Is there a family history HK:[id number]    Objective Information: General:unremarkable        Sebaceous cyst with punctum present posterior scalp, 1.5cm.  No drainage noted. Neck:  Supple without lymphadenopathy.  Heart:  RRR, no murmur Lungs:    CTA bilaterally, no wheezes, rhonchi, rales.  Breathing unlabored.  Assessment:Sebaceous cyst, scalp  Diagnosis &amp; Procedure: DiagnosisCode: 706.2, ProcedureCode: 43329,    Plan:Scheduled for excision of sebaceous cyst, scalp on 06/24/12.   Patient Education:Alternative treatments to surgery were  discussed with patient (and family).  Risks and benefits  of procedure were fully explained to the patient (and family) who gave informed consent. Patient/family questions were addressed.  Follow-up:Pending Surgery

## 2012-06-10 ENCOUNTER — Encounter (HOSPITAL_COMMUNITY): Admission: RE | Admit: 2012-06-10 | Payer: Managed Care, Other (non HMO) | Source: Ambulatory Visit

## 2012-06-13 ENCOUNTER — Encounter (HOSPITAL_COMMUNITY): Payer: Self-pay | Admitting: Pharmacy Technician

## 2012-06-17 ENCOUNTER — Other Ambulatory Visit: Payer: Self-pay

## 2012-06-17 ENCOUNTER — Encounter (HOSPITAL_COMMUNITY)
Admission: RE | Admit: 2012-06-17 | Discharge: 2012-06-17 | Disposition: A | Discharge status: Home / Self Care | Payer: Managed Care, Other (non HMO) | Source: Ambulatory Visit | Attending: General Surgery | Admitting: General Surgery

## 2012-06-17 ENCOUNTER — Encounter (HOSPITAL_COMMUNITY): Payer: Self-pay

## 2012-06-17 DIAGNOSIS — Z01818 Encounter for other preprocedural examination: Secondary | ICD-10-CM | POA: Insufficient documentation

## 2012-06-17 HISTORY — DX: Gastro-esophageal reflux disease without esophagitis: K21.9

## 2012-06-17 LAB — BASIC METABOLIC PANEL
BUN: 15 mg/dL (ref 6–23)
Chloride: 104 mEq/L (ref 96–112)
Creatinine, Ser: 0.96 mg/dL (ref 0.50–1.35)
GFR calc Af Amer: >90 mL/min (ref >90)

## 2012-06-17 LAB — CBC WITH DIFFERENTIAL/PLATELET
Basophils Absolute: 0 10*3/uL (ref 0.0–0.1)
Basophils Relative: 1 % (ref 0–1)
Eosinophils Relative: 4 % (ref 0–5)
HCT: 42 % (ref 39.0–52.0)
MCHC: 33.8 g/dL (ref 30.0–36.0)
Monocytes Absolute: 0.5 10*3/uL (ref 0.1–1.0)
Neutro Abs: 2.1 10*3/uL (ref 1.7–7.7)
RDW: 14.8 % (ref 11.5–15.5)

## 2012-06-17 LAB — SURGICAL PCR SCREEN: MRSA, PCR: NEGATIVE

## 2012-06-17 NOTE — Patient Instructions (Addendum)
20 Angel Powell  06/17/2012   Your procedure is scheduled on:  06/24/2012  Report to [facility] at  800  AM.  Call this number if you have problems the morning of surgery: [phone]   Remember:   Do not eat food:After Midnight.  May have clear liquids:until Midnight .    Take these medicines the morning of surgery with A SIP OF WATER:  prilosec   Do not wear jewelry, make-up or nail polish.  Do not wear lotions, powders, or perfumes. You may wear deodorant.  Do not shave 48 hours prior to surgery. Men may shave face and neck.  Do not bring valuables to the hospital.  Contacts, dentures or bridgework may not be worn into surgery.  Leave suitcase in the car. After surgery it may be brought to your room.  For patients admitted to the hospital, checkout time is 11:00 AM the day of discharge.   Patients discharged the day of surgery will not be allowed to drive home.  Name and phone number of your driver: family  Special Instructions: CHG Shower Use Special Wash: 1/2 bottle night before surgery and 1/2 bottle morning of surgery.   Please read over the following fact sheets that you were given: Pain Booklet, MRSA Information, Surgical Site Infection Prevention, Anesthesia Post-op Instructions and Care and Recovery After Surgery Epidermal Cyst An epidermal cyst is sometimes called a sebaceous cyst, epidermal inclusion cyst, or infundibular cyst. These cysts usually contain a substance that looks "pasty" or "cheesy" and may have a bad smell. This substance is a protein called keratin. Epidermal cysts are usually found on the face, neck, or trunk. They may also occur in the vaginal area or other parts of the genitalia of both men and women. Epidermal cysts are usually small, painless, slow-growing bumps or lumps that move freely under the skin. It is important not to try to pop them. This may cause an infection and lead to tenderness and swelling. CAUSES  Epidermal cysts may be caused by a  deep penetrating injury to the skin or a plugged hair follicle, often associated with acne. SYMPTOMS  Epidermal cysts can become inflamed and cause:  Redness.   Tenderness.   Increased temperature of the skin over the bumps or lumps.   Grayish-white, bad smelling material that drains from the bump or lump.  DIAGNOSIS  Epidermal cysts are easily diagnosed by your caregiver during an exam. Rarely, a tissue sample (biopsy) may be taken to rule out other conditions that may resemble epidermal cysts. TREATMENT   Epidermal cysts often get better and disappear on their own. They are rarely ever cancerous.   If a cyst becomes infected, it may become inflamed and tender. This may require opening and draining the cyst. Treatment with antibiotics may be necessary. When the infection is gone, the cyst may be removed with minor surgery.   Small, inflamed cysts can often be treated with antibiotics or by injecting steroid medicines.   Sometimes, epidermal cysts become large and bothersome. If this happens, surgical removal in your caregiver's office may be necessary.  HOME CARE INSTRUCTIONS  Only take over-the-counter or prescription medicines as directed by your caregiver.   Take your antibiotics as directed. Finish them even if you start to feel better.  SEEK MEDICAL CARE IF:   Your cyst becomes tender, red, or swollen.   Your condition is not improving or is getting worse.   You have any other questions or concerns.  MAKE SURE YOU:  Understand these instructions.   Will watch your condition.   Will get help right away if you are not doing well or get worse.  Document Released: 09/05/2004 Document Revised: 09/24/2011 Document Reviewed: 04/13/2011 [facility] Patient Information 2012 [place], [place].PATIENT INSTRUCTIONS POST-ANESTHESIA  IMMEDIATELY FOLLOWING SURGERY:  Do not drive or operate machinery for the first twenty four hours after surgery.  Do not make any important decisions for  twenty four hours after surgery or while taking narcotic pain medications or sedatives.  If you develop intractable nausea and vomiting or a severe headache please notify your doctor immediately.  FOLLOW-UP:  Please make an appointment with your surgeon as instructed. You do not need to follow up with anesthesia unless specifically instructed to do so.  WOUND CARE INSTRUCTIONS (if applicable):  Keep a dry clean dressing on the anesthesia/puncture wound site if there is drainage.  Once the wound has quit draining you may leave it open to air.  Generally you should leave the bandage intact for twenty four hours unless there is drainage.  If the epidural site drains for more than 36-48 hours please call the anesthesia department.  QUESTIONS?:  Please feel free to call your physician or the hospital operator if you have any questions, and they will be happy to assist you.

## 2012-06-24 ENCOUNTER — Ambulatory Visit (HOSPITAL_COMMUNITY)
Admission: RE | Admit: 2012-06-24 | Payer: Managed Care, Other (non HMO) | Source: Ambulatory Visit | Admitting: General Surgery

## 2012-06-24 SURGERY — EXCISION MASS
Anesthesia: Choice | Site: Scalp

## 2016-12-15 ENCOUNTER — Telehealth: Payer: Self-pay | Admitting: Gastroenterology

## 2016-12-15 NOTE — Telephone Encounter (Signed)
Letter mailed

## 2016-12-15 NOTE — Telephone Encounter (Signed)
RECALL FOR TCS °

## 2017-11-09 ENCOUNTER — Ambulatory Visit: Payer: Managed Care, Other (non HMO)

## 2017-11-11 ENCOUNTER — Ambulatory Visit: Payer: Self-pay

## 2017-11-18 ENCOUNTER — Telehealth: Payer: Self-pay | Admitting: *Deleted

## 2017-11-18 ENCOUNTER — Ambulatory Visit: Payer: Self-pay

## 2017-11-18 ENCOUNTER — Encounter: Payer: Self-pay | Admitting: *Deleted

## 2017-11-18 NOTE — Telephone Encounter (Signed)
Patient was a no show and letter sent  °

## 2017-11-18 NOTE — Telephone Encounter (Signed)
noted 

## 2017-11-30 ENCOUNTER — Ambulatory Visit: Payer: Self-pay

## 2017-11-30 ENCOUNTER — Ambulatory Visit (INDEPENDENT_AMBULATORY_CARE_PROVIDER_SITE_OTHER): Payer: Self-pay

## 2017-11-30 DIAGNOSIS — Z1211 Encounter for screening for malignant neoplasm of colon: Secondary | ICD-10-CM

## 2017-11-30 MED ORDER — PEG-KCL-NACL-NASULF-NA ASC-C 100 G PO SOLR: 1.0000 | ORAL | 0 refills | Status: DC

## 2017-11-30 NOTE — Patient Instructions (Addendum)
Angel Powell   Aug 05, 1956 MRN: [id number]    Procedure Date: 03/07/18/19 Time to register: 8:15 Place to register: [facility] Procedure Time: 9:15 Scheduled provider: Barney Drain, MD  PREPARATION FOR COLONOSCOPY WITH TRI-LYTE SPLIT PREP  Please notify us immediately if you are diabetic, take iron supplements, or if you are on Coumadin or any other blood thinners.     You will need to purchase 1 fleet enema and 1 box of Bisacodyl 68m tablets.     1 DAY BEFORE PROCEDURE:  DATE:03/07/18   DAY: Sunday  Clear liquids the entire day - NO SOLID FOOD.     At 2:00 pm:  Take 2 Bisacodyl tablets.   At 4:00pm:  Start drinking your solution. Make sure you mix well per instructions on the bottle. Try to drink 1 (one) 8 ounce glass every 10-15 minutes until you have consumed HALF the jug. You should complete by 6:00pm.You must keep the left over solution refrigerated until completed next day.  Continue clear liquids. You must drink plenty of clear liquids to prevent dehyration and kidney failure. Nothing to eat or drink after midnight.  EXCEPTION: If you take medications for your heart, blood pressure or breathing, you may take these medications with a small amount of clear liquid.    DAY OF PROCEDURE:   DATE: 03/07/18   DAY: Monday   Five hours before your procedure time _0 :15am:  Finish remaining amout of bowel prep, drinking 1 (one) 8 ounce glass every 10-15 minutes until complete. You have two hours to consume remaining prep.    Three hours before your procedure time _1 :15am:  Nothing by mouth.   At least one hour before going to the hospital:  Give yourself one Fleet enema. You may take your morning medications with sip of water unless we have instructed otherwise.      Please see below for Dietary Information.  CLEAR LIQUIDS INCLUDE:  Water Jello (NOT red in color)   Ice Popsicles (NOT red in color)   Tea (sugar ok, no milk/cream) Powdered fruit flavored drinks   Coffee (sugar ok, no milk/cream) Gatorade/ Lemonade/ Kool-Aid  (NOT red in color)   Juice: apple, white grape, white cranberry Soft drinks  Clear bullion, consomme, broth (fat free beef/chicken/vegetable)  Carbonated beverages (any kind)  Strained chicken noodle soup Hard Candy   Remember: Clear liquids are liquids that will allow you to see your fingers on the other side of a clear glass. Be sure liquids are NOT red in color, and not cloudy, but CLEAR.  DO NOT EAT OR DRINK ANY OF THE FOLLOWING:  Dairy products of any kind   Cranberry juice Tomato juice / V8 juice   Grapefruit juice Orange juice     Red grape juice  Do not eat any solid foods, including such foods as: cereal, oatmeal, yogurt, fruits, vegetables, creamed soups, eggs, bread, crackers, pureed foods in a blender, etc.   HELPFUL HINTS FOR DRINKING PREP SOLUTION:   Make sure prep is extremely cold. Mix and refrigerate the the morning of the prep. You may also put in the freezer.   You may try mixing some Crystal Light or Country Time Lemonade if you prefer. Mix in small amounts; add more if necessary.  Try drinking through a straw  Rinse mouth with water or a mouthwash between glasses, to remove after-taste.  Try sipping on a cold beverage /ice/ popsicles between glasses of prep.  Place a piece of sugar-free hard candy in  mouth between glasses.  If you become nauseated, try consuming smaller amounts, or stretch out the time between glasses. Stop for 30-60 minutes, then slowly start back drinking.        OTHER INSTRUCTIONS  You will need a responsible adult at least 62 years of age to accompany you and drive you home. This person must remain in the waiting room during your procedure. The hospital will cancel your procedure if you do not have a responsible adult with you.   1. Wear loose fitting clothing that is easily removed. 2. Leave jewelry and other valuables at home.  3. Remove all body piercing jewelry and  leave at home. 4. Total time from sign-in until discharge is approximately 2-3 hours. 5. You should go home directly after your procedure and rest. You can resume normal activities the day after your procedure. 6. The day of your procedure you should not:  Drive  Make legal decisions  Operate machinery  Drink alcohol  Return to work   You may call the office (Dept: [phone]) before 5:00pm, or page the doctor on call [phone]) after 5:00pm, for further instructions, if necessary.   Insurance Information YOU WILL NEED TO CHECK WITH YOUR INSURANCE COMPANY FOR THE BENEFITS OF COVERAGE YOU HAVE FOR THIS PROCEDURE.  UNFORTUNATELY, NOT ALL INSURANCE COMPANIES HAVE BENEFITS TO COVER ALL OR PART OF THESE TYPES OF PROCEDURES.  IT IS YOUR RESPONSIBILITY TO CHECK YOUR BENEFITS, HOWEVER, WE WILL BE GLAD TO ASSIST YOU WITH ANY CODES YOUR INSURANCE COMPANY MAY NEED.    PLEASE NOTE THAT MOST INSURANCE COMPANIES WILL NOT COVER A SCREENING COLONOSCOPY FOR PEOPLE UNDER THE AGE OF 50  IF YOU HAVE BCBS INSURANCE, YOU MAY HAVE BENEFITS FOR A SCREENING COLONOSCOPY BUT IF POLYPS ARE FOUND THE DIAGNOSIS WILL CHANGE AND THEN YOU MAY HAVE A DEDUCTIBLE THAT WILL NEED TO BE MET. SO PLEASE MAKE SURE YOU CHECK YOUR BENEFITS FOR A SCREENING COLONOSCOPY AS WELL AS A DIAGNOSTIC COLONOSCOPY.

## 2017-11-30 NOTE — Progress Notes (Addendum)
Gastroenterology Pre-Procedure Review  Request Date:11/30/17 Requesting Physician: Dr.Fanta (last tcs 01/2007 [facility] no polyps)  PATIENT REVIEW QUESTIONS: The patient responded to the following health history questions as indicated:    1. Diabetes Melitis: no 2. Joint replacements in the past 12 months: no 3. Major health problems in the past 3 months: no 4. Has an artificial valve or MVP: no 5. Has a defibrillator: no 6. Has been advised in past to take antibiotics in advance of a procedure like teeth cleaning: no 7. Family history of colon cancer: no  8. Alcohol Use: yes (few beers on the weekend) 9. History of sleep apnea: no  10. History of coronary artery or other vascular stents placed within the last 12 months: no 11. History of any prior anesthesia complications: no    MEDICATIONS & ALLERGIES:    Patient reports the following regarding taking any blood thinners:   Plavix? no Aspirin? no Coumadin? no Brilinta? no Xarelto? no Eliquis? no Pradaxa? no Savaysa? no Effient? no  Patient confirms/reports the following medications:  Current Outpatient Medications  Medication Sig Dispense Refill  . simvastatin (ZOCOR) 20 MG tablet Take 20 mg by mouth every evening.    Marland Kitchen. tetrahydrozoline 0.05 % ophthalmic solution Place 1 drop into both eyes daily as needed. Dry Eyes    . fish oil-omega-3 fatty acids 1000 MG capsule Take 1 g by mouth daily.     . Multiple Vitamins-Minerals (MULTIVITAMINS THER. W/MINERALS) TABS Take 1 tablet by mouth daily.      Marland Kitchen. omeprazole (PRILOSEC) 40 MG capsule Take 40 mg by mouth daily as needed. Acid Reflux     No current facility-administered medications for this visit.     Patient confirms/reports the following allergies:  No Known Allergies  No orders of the defined types were placed in this encounter.   AUTHORIZATION INFORMATION Primary [place] [id number],   Pre-Cert / Auth required: no Pre-Cert / Auth #: [id number]: [id number]  Vickie   SCHEDULE INFORMATION: Procedure has been scheduled as follows:  Date: 01/07/18, Time: 10:30 Location: [facility] Dr[facility]  This Gastroenterology Pre-Precedure Review Form is being routed to the following provider(s): Wynne DustEric Gill NP

## 2017-12-02 NOTE — Progress Notes (Signed)
Ok to schedule.

## 2017-12-17 MED ORDER — PEG 3350-KCL-NA BICARB-NACL 420 G PO SOLR: 4000.0000 mL | ORAL | 0 refills | Status: DC

## 2017-12-17 NOTE — Progress Notes (Signed)
Pt called- moviprep is $70. trilyte sent in and new instructions done and mailed to the pt.

## 2017-12-17 NOTE — Addendum Note (Signed)
Addended by: Myra RudeLAWSON, JULIE H on: 12/17/2017 12:44 PM   Modules accepted: Orders

## 2018-01-08 ENCOUNTER — Emergency Department (HOSPITAL_COMMUNITY): Payer: BLUE CROSS/BLUE SHIELD

## 2018-01-08 ENCOUNTER — Emergency Department (HOSPITAL_COMMUNITY)
Admission: EM | Admit: 2018-01-08 | Discharge: 2018-01-08 | Disposition: A | Discharge status: Home / Self Care | Payer: BLUE CROSS/BLUE SHIELD | Attending: Emergency Medicine | Admitting: Emergency Medicine

## 2018-01-08 ENCOUNTER — Encounter (HOSPITAL_COMMUNITY): Payer: Self-pay | Admitting: Cardiology

## 2018-01-08 DIAGNOSIS — S199XXD Unspecified injury of neck, subsequent encounter: Secondary | ICD-10-CM | POA: Diagnosis present

## 2018-01-08 DIAGNOSIS — F17210 Nicotine dependence, cigarettes, uncomplicated: Secondary | ICD-10-CM | POA: Diagnosis not present

## 2018-01-08 DIAGNOSIS — S161XXD Strain of muscle, fascia and tendon at neck level, subsequent encounter: Secondary | ICD-10-CM | POA: Insufficient documentation

## 2018-01-08 DIAGNOSIS — W109XXD Fall (on) (from) unspecified stairs and steps, subsequent encounter: Secondary | ICD-10-CM | POA: Insufficient documentation

## 2018-01-08 DIAGNOSIS — Z79899 Other long term (current) drug therapy: Secondary | ICD-10-CM | POA: Insufficient documentation

## 2018-01-08 DIAGNOSIS — W19XXXD Unspecified fall, subsequent encounter: Secondary | ICD-10-CM

## 2018-01-08 DIAGNOSIS — E7800 Pure hypercholesterolemia, unspecified: Secondary | ICD-10-CM | POA: Insufficient documentation

## 2018-01-08 MED ORDER — ACETAMINOPHEN 325 MG PO TABS
650.0000 mg | ORAL_TABLET | Freq: Once | ORAL | Status: AC
  Administered 2018-01-08: 650 mg via ORAL
  Filled 2018-01-08: qty 2

## 2018-01-08 NOTE — ED Notes (Signed)
Pt returned from ct no c/o at this time

## 2018-01-08 NOTE — ED Triage Notes (Signed)
Slipped on steps Thursday.  Fell back and hit neck on edge of steps.  C/o neck pain

## 2018-01-08 NOTE — Discharge Instructions (Addendum)
Continue taking the medicines you were prescribed yesterday.  I also suggest applying a heating pad to your neck area 20 minutes several times daily.  You may also continue using ice packs as well.  Your CT scan is negative for any acute injury.  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

## 2018-01-08 NOTE — ED Provider Notes (Signed)
[facility] Provider Note   CSN: [id number] Arrival date & time: 01/08/18  [id number]     History   Chief Complaint Chief Complaint  Patient presents with  . Neck Injury    HPI Angel Powell is a 62 y.o. male presenting with persistent right lateral posterior neck pain since falling 2 days ago.  He describes slipping on the bottom step, his feet came out from under him and he landed flat on his back on cement but his his neck on the edge of the bottom step. He denies hitting his head and denies LOC.  He reports having an approximate 10 minute episode of paralysis in all 4 extremities, then this symptom resolved.  He was checked out by EMS but refused transfer to the local hospital [place]  He was seen at an urgent care center yesterday and placed on flexeril and another medication which he cannot name at present but he declined xrays yesterday due to cost.  He is concerned about persistent localized pain and thinks he broke a bone in his neck.  He denies any further pain, numbness or weakness in his extremities and denies any other pain including back, hips, buttock or head. Also denies n/v, dizziness or vision changes.  The history is provided by the patient.    Past Medical History:  Diagnosis Date  . GERD (gastroesophageal reflux disease)   . Hypercholesteremia   . Hypercholesteremia     Patient Active Problem List   Diagnosis Date Noted  . RUPTURE ROTATOR CUFF 01/07/2009  . DEGENERATIVE JOINT DISEASE, RIGHT SHOULDER 12/05/2007  . SHOULDER PAIN 12/05/2007    Past Surgical History:  Procedure Laterality Date  . bone spur     right  . ROTATOR CUFF REPAIR     left, [facility]; [place]        Home Medications    Prior to Admission medications   Medication Sig Start Date End Date Taking? Authorizing Provider  atorvastatin (LIPITOR) 20 MG tablet Take 20 mg by mouth daily.    [provider]  omeprazole (PRILOSEC) 40 MG capsule Take 40 mg by  mouth daily as needed. Acid Reflux    [provider]  tetrahydrozoline 0.05 % ophthalmic solution Place 1 drop into both eyes daily as needed (CLEAR EYES). Dry Eyes     [provider]  gabapentin (NEURONTIN) 100 MG capsule Take 300 mg by mouth 3 (three) times daily.    01/02/12  [provider]    Family History History reviewed. No pertinent family history.  Social History Social History   Tobacco Use  . Smoking status: Current Every Day Smoker    Packs/day: 0.50    Years: 0.50    Pack years: 0.25    Types: Cigarettes  Substance Use Topics  . Alcohol use: Yes    Comment: occassional beer  . Drug use: No     Allergies   Patient has no known allergies.   Review of Systems Review of Systems  Constitutional: Negative for chills and fever.  Eyes: Negative for visual disturbance.  Respiratory: Negative.   Cardiovascular: Negative.   Gastrointestinal: Negative for nausea and vomiting.  Musculoskeletal: Positive for neck pain. Negative for arthralgias, joint swelling and myalgias.  Neurological: Negative for dizziness, weakness, numbness and headaches.     Physical Exam Updated Vital Signs BP (!) 128/97 (BP Location: Left Arm)   Pulse 97   Temp 97.8 F (36.6 C) (Oral)   Resp 16  Ht 5\' 9"  (1.753 m)   Wt 84.8 kg (187 lb)   SpO2 97%   BMI 27.62 kg/m   Physical Exam  Constitutional: He appears well-developed and well-nourished.  HENT:  Head: Atraumatic.  Neck: Normal range of motion.  Cardiovascular:  Pulses equal bilaterally  Musculoskeletal: He exhibits no tenderness.       Cervical back: He exhibits bony tenderness. He exhibits normal range of motion, no edema, no deformity and no spasm.  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  Neurological: He is alert. He has normal strength. He displays normal reflexes. No sensory deficit.  Skin: Skin is warm and dry.  Psychiatric: He has a normal mood and  affect.     ED Treatments / Results  Labs (all labs ordered are listed, but only abnormal results are displayed) Labs Reviewed - No data to display  EKG None  Radiology Ct Cervical Spine Wo Contrast  Result Date: 01/08/2018 CLINICAL DATA:  Neck pain after slipping on steps and hitting the back of his neck 2 days ago. EXAM: CT CERVICAL SPINE WITHOUT CONTRAST TECHNIQUE: Multidetector CT imaging of the cervical spine was performed without intravenous contrast. Multiplanar CT image reconstructions were also generated. COMPARISON:  Cervical spine radiographs dated 02/06/2009. FINDINGS: Alignment: Minimal anterolisthesis at the C7-T1 level. Skull base and vertebrae: No acute fracture. Cysts/Schmorl's nodes in the C3, C4, C5, C6, C7 and T1 vertebral bodies, gas-filled at the C4 level. Soft tissues and spinal canal: No prevertebral fluid or swelling. No visible canal hematoma. Disc levels: Moderate posterior disc protrusion and associated spur formation at the C3-4 level with moderate bilateral anterolateral spur formation at that level. Moderate anterior and mild posterior spur formation at the C4-5 and C5-6 levels. Mild anterior and posterior spur formation at the C6-7 level. Facet degenerative changes at the C6-7 and C7-T1 levels. Upper chest: Bilateral bullous changes and tiny left apical calcified granuloma. Other: Mild bilateral carotid artery calcifications. Unremarkable thyroid gland. IMPRESSION: 1. No cervical spine fracture or traumatic subluxation. 2. Multilevel degenerative changes. 3. 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 4. Mild bilateral carotid artery atheromatous calcification. Electronically Signed   By: Beckie Salts M.D.   On: 01/08/2018 10:44    Procedures Procedures (including critical care time)  Medications Ordered in ED Medications  acetaminophen (TYLENOL) tablet 650 mg (650 mg Oral Given 01/08/18 0959)     Initial Impression / Assessment and Plan / ED Course  I have  reviewed the triage vital signs and the nursing notes.  Pertinent labs & imaging results that were available during my care of the patient were reviewed by me and considered in my medical decision making (see chart for details).     Call to [facility] confirming pt was prescribed flexeril and relafen 750 mg bid ytd.  He was advised to continue these meds. Discussed ice/heat tx.  Prn f/u with pcp if sx persist or worsen.  No neuro deficits on todays exam, no signs/sx of head injury.  Prn f/u anticipated.  Final Clinical Impressions(s) / ED Diagnoses   Final diagnoses:  Fall, subsequent encounter  Cervical strain, acute, subsequent encounter    ED Discharge Orders    None       Victoriano Lain 01/08/18 1100    Eber Hong, MD 01/09/18 [phone]

## 2018-01-12 NOTE — Progress Notes (Signed)
Pt missed his tcs last week. I finally was able to reach pt and got him rescheduled for 03/07/18 with [facility] at 9:15. New instructions have been done and mailed to the pt. Called [name] and she has moved his orders to the new day and time. Pt said he was out of town and he couldn't get a signal on his phone.

## 2018-03-07 ENCOUNTER — Telehealth: Payer: Self-pay | Admitting: Gastroenterology

## 2018-03-07 ENCOUNTER — Ambulatory Visit (HOSPITAL_COMMUNITY)
Admission: RE | Admit: 2018-03-07 | Payer: BLUE CROSS/BLUE SHIELD | Source: Ambulatory Visit | Admitting: Gastroenterology

## 2018-03-07 SURGERY — COLONOSCOPY
Anesthesia: Moderate Sedation

## 2018-03-07 NOTE — Telephone Encounter (Signed)
Tammy from Short Stay called to say patient has to work and needed to cancel his procedure with [facility] for today.

## 2018-03-07 NOTE — Telephone Encounter (Signed)
Noted, this is the second procedure cancellation.

## 2018-03-08 ENCOUNTER — Emergency Department (HOSPITAL_COMMUNITY)
Admission: EM | Admit: 2018-03-08 | Discharge: 2018-03-08 | Disposition: A | Discharge status: Home / Self Care | Payer: BLUE CROSS/BLUE SHIELD | Attending: Emergency Medicine | Admitting: Emergency Medicine

## 2018-03-08 ENCOUNTER — Other Ambulatory Visit: Payer: Self-pay

## 2018-03-08 ENCOUNTER — Encounter (HOSPITAL_COMMUNITY): Payer: Self-pay

## 2018-03-08 DIAGNOSIS — Z79899 Other long term (current) drug therapy: Secondary | ICD-10-CM | POA: Insufficient documentation

## 2018-03-08 DIAGNOSIS — L720 Epidermal cyst: Secondary | ICD-10-CM | POA: Insufficient documentation

## 2018-03-08 DIAGNOSIS — F17210 Nicotine dependence, cigarettes, uncomplicated: Secondary | ICD-10-CM | POA: Insufficient documentation

## 2018-03-08 DIAGNOSIS — R222 Localized swelling, mass and lump, trunk: Secondary | ICD-10-CM | POA: Diagnosis present

## 2018-03-08 MED ORDER — LIDOCAINE HCL (PF) 2 % IJ SOLN
INTRAMUSCULAR | Status: AC
  Administered 2018-03-08: 10 mL via INTRADERMAL
  Filled 2018-03-08: qty 20

## 2018-03-08 MED ORDER — SULFAMETHOXAZOLE-TRIMETHOPRIM 800-160 MG PO TABS: 1.0000 | ORAL_TABLET | Freq: Two times a day (BID) | ORAL | 0 refills | Status: AC

## 2018-03-08 MED ORDER — LIDOCAINE HCL (PF) 2 % IJ SOLN
10.0000 mL | Freq: Once | INTRAMUSCULAR | Status: AC
  Administered 2018-03-08: 10 mL via INTRADERMAL

## 2018-03-08 NOTE — Discharge Instructions (Addendum)
Avoid squeezing to the area.  You may apply warm wet compresses or warm water soaks 2-3 times a day until both areas heal.  The packing in your upper back will need to be removed in 2 days.  Return to the ER for any worsening symptoms including fever, chills, increasing pain or redness to the site.  You may take over-the-counter Tylenol every 4 hours if needed for pain

## 2018-03-08 NOTE — ED Triage Notes (Signed)
Pt reports has had a lump on his back for the past year or so but 2 days ago it became red, tender, and started draining.  Reports has another lump on back of neck.

## 2018-03-08 NOTE — ED Provider Notes (Signed)
[facility] EMERGENCY DEPARTMENT Provider Note   CSN: [id number] Arrival date & time: 03/08/18  0805     History   Chief Complaint Chief Complaint  Patient presents with  . Wound Infection    HPI Angel Powell is a 62 y.o. male.  HPI   Angel Powell is a 62 y.o. male who presents to the Emergency Department complaining of painful swollen area to the mid upper back.  Area has been present for approximately 1 year, but 2 days ago the area became red, tender and increased in size after his partner was squeezing on it.  He states it has been draining pus.  He also reports a similar appearing area to the back of his neck.  He states the pain to the area is worse with palpation and when he lies supine.  He denies fever, chills, chest or abdominal pain, low back pain, nausea or vomiting.  He has not tried any therapies for symptomatic relief prior to arrival.   Past Medical History:  Diagnosis Date  . GERD (gastroesophageal reflux disease)   . Hypercholesteremia   . Hypercholesteremia     Patient Active Problem List   Diagnosis Date Noted  . RUPTURE ROTATOR CUFF 01/07/2009  . DEGENERATIVE JOINT DISEASE, RIGHT SHOULDER 12/05/2007  . SHOULDER PAIN 12/05/2007    Past Surgical History:  Procedure Laterality Date  . bone spur     right  . ROTATOR CUFF REPAIR     left, [facility]; [place]        Home Medications    Prior to Admission medications   Medication Sig Start Date End Date Taking? Authorizing Provider  atorvastatin (LIPITOR) 20 MG tablet Take 20 mg by mouth daily.    [provider]  omeprazole (PRILOSEC) 40 MG capsule Take 40 mg by mouth daily as needed. Acid Reflux    [provider]  tetrahydrozoline 0.05 % ophthalmic solution Place 1 drop into both eyes daily as needed (CLEAR EYES). Dry Eyes     [provider]  gabapentin (NEURONTIN) 100 MG capsule Take 300 mg by mouth 3 (three) times daily.    01/02/12  [provider]     Family History No family history on file.  Social History Social History   Tobacco Use  . Smoking status: Current Every Day Smoker    Packs/day: 0.50    Years: 0.50    Pack years: 0.25    Types: Cigarettes  . Smokeless tobacco: Never Used  Substance Use Topics  . Alcohol use: Yes    Comment: occassional beer  . Drug use: No     Allergies   Patient has no known allergies.   Review of Systems Review of Systems  Constitutional: Negative for chills and fever.  Respiratory: Negative for chest tightness and shortness of breath.   Cardiovascular: Negative for chest pain.  Gastrointestinal: Negative for abdominal pain, nausea and vomiting.  Musculoskeletal: Negative for arthralgias and joint swelling.  Skin: Positive for color change.       Painful swollen area to the mid upper back and posterior neck  Neurological: Negative for weakness and numbness.  Hematological: Negative for adenopathy.  All other systems reviewed and are negative.    Physical Exam Updated Vital Signs BP 122/83 (BP Location: Left Arm)   Pulse 72   Temp 97.8 F (36.6 C) (Oral)   Resp 18   Ht  (1.753 m)   Wt 84.8 kg (187 lb)   SpO2 100%  BMI 27.62 kg/m   Physical Exam  Constitutional: He is oriented to person, place, and time. He appears well-developed and well-nourished. No distress.  HENT:  Head: Normocephalic and atraumatic.  Neck: Normal range of motion. Neck supple.  Cardiovascular: Normal rate and regular rhythm.  Pulmonary/Chest: Effort normal and breath sounds normal. No respiratory distress.  Musculoskeletal: Normal range of motion. He exhibits no edema or tenderness.  Neurological: He is alert and oriented to person, place, and time. No sensory deficit. He exhibits normal muscle tone. Coordination normal.  Skin: Skin is warm and dry. Capillary refill takes less than 2 seconds. There is erythema.  3 cm fluctuant nodule to the posterior neck.  No erythema or drainage.  6 cm  erythematous fluctuant mass to the mid upper back with several pinpoint areas of purulent drainage.  Mild surrounding erythema  Nursing note and vitals reviewed.    ED Treatments / Results  Labs (all labs ordered are listed, but only abnormal results are displayed) Labs Reviewed - No data to display  EKG None  Radiology No results found.  Procedures Procedures (including critical care time)  INCISION AND DRAINAGE #1 Performed by: Maxwell Caul. Consent: Verbal consent obtained. Risks and benefits: risks, benefits and alternatives were discussed Type: abscess  Body area: mid upper back  Anesthesia: local infiltration  Incision was made with a #11 scalpel.  Local anesthetic: lidocaine 2 % w/o epinephrine  Anesthetic total: 4 ml  Complexity: complex Blunt dissection to break up loculations  Drainage: purulent  Drainage amount: large  Packing material: 1/4 in iodoform gauze  Patient tolerance: Patient tolerated the procedure well with no immediate complications.     INCISION AND DRAINAGE  #2 Performed by: Maxwell Caul. Consent: Verbal consent obtained. Risks and benefits: risks, benefits and alternatives were discussed Type: abscess  Body area: posterior neck  Anesthesia: local infiltration  Incision was made with a #11 scalpel.  Local anesthetic: lidocaine 2 % w/o epinephrine  Anesthetic total: 2 ml  Complexity: complex Blunt dissection to break up loculations  Drainage: purulent  Drainage amount: large  Packing material: none  Patient tolerance: Patient tolerated the procedure well with no immediate complications.    Medications Ordered in ED Medications  lidocaine (XYLOCAINE) 2 % injection 10 mL (has no administration in time range)     Initial Impression / Assessment and Plan / ED Course  I have reviewed the triage vital signs and the nursing notes.  Pertinent labs & imaging results that were available during my care of the  patient were reviewed by me and considered in my medical decision making (see chart for details).     Patient well-appearing, vitals reviewed.  Pt has 2 epidermal cysts, both were I &D successfully.  Pain improved.  Mild surrounding cellulitis of the upper back, so will begin antibiotics.  Patient agrees to warm compresses or soaks and packing removal in 2 days.  Return precautions were discussed.   Final Clinical Impressions(s) / ED Diagnoses   Final diagnoses:  Epidermal cyst of neck  Epidermal cyst    ED Discharge Orders    None       Rosey Bath 03/08/18 2014    Margarita Grizzle, MD 03/09/18 1230

## 2018-10-28 ENCOUNTER — Encounter: Payer: Self-pay | Admitting: Gastroenterology

## 2018-11-28 ENCOUNTER — Ambulatory Visit: Payer: BLUE CROSS/BLUE SHIELD

## 2019-09-22 ENCOUNTER — Other Ambulatory Visit: Payer: Self-pay

## 2019-09-22 ENCOUNTER — Ambulatory Visit (HOSPITAL_COMMUNITY)
Admission: RE | Admit: 2019-09-22 | Discharge: 2019-09-22 | Disposition: A | Discharge status: Home / Self Care | Payer: BC Managed Care – PPO | Source: Ambulatory Visit | Attending: Internal Medicine | Admitting: Internal Medicine

## 2019-09-22 ENCOUNTER — Other Ambulatory Visit (HOSPITAL_COMMUNITY): Payer: Self-pay | Admitting: Internal Medicine

## 2019-09-22 ENCOUNTER — Other Ambulatory Visit (HOSPITAL_COMMUNITY)
Admission: RE | Admit: 2019-09-22 | Discharge: 2019-09-22 | Disposition: A | Discharge status: Home / Self Care | Payer: BC Managed Care – PPO | Source: Ambulatory Visit | Attending: Internal Medicine | Admitting: Internal Medicine

## 2019-09-22 DIAGNOSIS — M545 Low back pain, unspecified: Secondary | ICD-10-CM

## 2019-09-22 DIAGNOSIS — E785 Hyperlipidemia, unspecified: Secondary | ICD-10-CM | POA: Insufficient documentation

## 2019-09-22 DIAGNOSIS — K219 Gastro-esophageal reflux disease without esophagitis: Secondary | ICD-10-CM | POA: Insufficient documentation

## 2019-09-22 LAB — CBC WITH DIFFERENTIAL/PLATELET
Abs Immature Granulocytes: 0 10*3/uL (ref 0.00–0.07)
Basophils Absolute: 0 10*3/uL (ref 0.0–0.1)
Basophils Relative: 1 %
Eosinophils Absolute: 0 10*3/uL (ref 0.0–0.5)
Eosinophils Relative: 1 %
HCT: 41.6 % (ref 39.0–52.0)
Hemoglobin: 13.8 g/dL (ref 13.0–17.0)
Immature Granulocytes: 0 %
Lymphocytes Relative: 32 %
Lymphs Abs: 0.9 10*3/uL (ref 0.7–4.0)
MCH: 33 pg (ref 26.0–34.0)
MCHC: 33.2 g/dL (ref 30.0–36.0)
MCV: 99.5 fL (ref 80.0–100.0)
Monocytes Absolute: 0.5 10*3/uL (ref 0.1–1.0)
Monocytes Relative: 17 %
Neutro Abs: 1.4 10*3/uL — ABNORMAL LOW (ref 1.7–7.7)
Neutrophils Relative %: 49 %
Platelets: 110 10*3/uL — ABNORMAL LOW (ref 150–400)
RBC: 4.18 MIL/uL — ABNORMAL LOW (ref 4.22–5.81)
RDW: 15.2 % (ref 11.5–15.5)
WBC: 2.8 10*3/uL — ABNORMAL LOW (ref 4.0–10.5)
nRBC: 0 % (ref 0.0–0.2)

## 2019-09-22 LAB — HEPATIC FUNCTION PANEL
ALT: 48 U/L — ABNORMAL HIGH (ref 0–44)
AST: 61 U/L — ABNORMAL HIGH (ref 15–41)
Albumin: 4.5 g/dL (ref 3.5–5.0)
Alkaline Phosphatase: 64 U/L (ref 38–126)
Bilirubin, Direct: 0.2 mg/dL (ref 0.0–0.2)
Indirect Bilirubin: 0.7 mg/dL (ref 0.3–0.9)
Total Bilirubin: 0.9 mg/dL (ref 0.3–1.2)
Total Protein: 7.9 g/dL (ref 6.5–8.1)

## 2019-09-22 LAB — BASIC METABOLIC PANEL
Anion gap: 12 (ref 5–15)
BUN: 13 mg/dL (ref 8–23)
CO2: 26 mmol/L (ref 22–32)
Calcium: 9.5 mg/dL (ref 8.9–10.3)
Chloride: 104 mmol/L (ref 98–111)
Creatinine, Ser: 0.76 mg/dL (ref 0.61–1.24)
GFR calc Af Amer: >60 mL/min (ref >60)
GFR calc non Af Amer: >60 mL/min (ref >60)
Glucose, Bld: 114 mg/dL — ABNORMAL HIGH (ref 70–99)
Potassium: 4.2 mmol/L (ref 3.5–5.1)
Sodium: 142 mmol/L (ref 135–145)

## 2019-09-22 LAB — LIPID PANEL
Cholesterol: 294 mg/dL — ABNORMAL HIGH (ref 0–200)
HDL: 106 mg/dL (ref >40)
LDL Cholesterol: 176 mg/dL — ABNORMAL HIGH (ref 0–99)
Total CHOL/HDL Ratio: 2.8 RATIO
Triglycerides: 62 mg/dL (ref <150)
VLDL: 12 mg/dL (ref 0–40)

## 2019-09-27 ENCOUNTER — Other Ambulatory Visit: Payer: Self-pay

## 2019-09-27 ENCOUNTER — Other Ambulatory Visit (HOSPITAL_COMMUNITY)
Admission: RE | Admit: 2019-09-27 | Discharge: 2019-09-27 | Disposition: A | Discharge status: Home / Self Care | Payer: BC Managed Care – PPO | Source: Ambulatory Visit | Attending: Internal Medicine | Admitting: Internal Medicine

## 2019-09-27 DIAGNOSIS — B1920 Unspecified viral hepatitis C without hepatic coma: Secondary | ICD-10-CM | POA: Insufficient documentation

## 2019-09-27 DIAGNOSIS — B1910 Unspecified viral hepatitis B without hepatic coma: Secondary | ICD-10-CM | POA: Insufficient documentation

## 2019-09-27 LAB — HEPATITIS B SURFACE ANTIGEN: Hepatitis B Surface Ag: NONREACTIVE

## 2019-09-27 LAB — HEPATITIS B SURFACE ANTIBODY,QUALITATIVE: Hep B S Ab: NONREACTIVE

## 2019-09-28 LAB — HCV COMMENT:

## 2019-09-28 LAB — HEPATITIS C ANTIBODY (REFLEX): HCV Ab: <0.1 s/co ratio (ref 0.0–0.9)

## 2019-10-10 ENCOUNTER — Other Ambulatory Visit: Payer: Self-pay

## 2019-10-10 ENCOUNTER — Ambulatory Visit: Payer: BC Managed Care – PPO | Attending: Internal Medicine

## 2019-10-10 DIAGNOSIS — Z20822 Contact with and (suspected) exposure to covid-19: Secondary | ICD-10-CM

## 2019-10-11 LAB — NOVEL CORONAVIRUS, NAA: SARS-CoV-2, NAA: DETECTED — AB

## 2019-10-16 ENCOUNTER — Encounter: Payer: Self-pay | Admitting: Gastroenterology

## 2019-10-28 ENCOUNTER — Encounter: Payer: Self-pay | Admitting: Gastroenterology

## 2019-10-28 NOTE — Progress Notes (Deleted)
Referring Provider: Rosita Fire, MD Primary Care Physician:  Rosita Fire, MD Primary GI Physician: Dr. Oneida Alar  No chief complaint on file.   HPI:   Angel Powell is a 64 y.o. male presenting today at the request of Rosita Fire, MD for abnormal LFTs.    Reviewed recent labs with most relevant findings below: 09/22/2019:  HFP with AST 61 (H), ALT 48 (H), otherwise normal. CBC remarkable for WBC 2.8 (L), platelets 110 (L), hemoglobin normal at 13.8. Lipid panel with total cholesterol 294 (H), LDL 176 (H), otherwise normal. 09/27/2019: Hepatitis B and hepatitis C negative.  No immunity to hepatitis B. Positive for COVID-19 on 10/10/2019.  Today:    Past Medical History:  Diagnosis Date  . GERD (gastroesophageal reflux disease)   . Hypercholesteremia   . Hypercholesteremia     Past Surgical History:  Procedure Laterality Date  . bone spur     right  . COLONOSCOPY  02/10/2007   Small internal hemorrhoids.  Otherwise normal colon.  Repeat in 10 years.  Marland Kitchen ROTATOR CUFF REPAIR     left, [facility]; [place]    Current Outpatient Medications  Medication Sig Dispense Refill  . atorvastatin (LIPITOR) 20 MG tablet Take 20 mg by mouth daily.    Marland Kitchen omeprazole (PRILOSEC) 40 MG capsule Take 40 mg by mouth daily as needed. Acid Reflux    . tetrahydrozoline 0.05 % ophthalmic solution Place 1 drop into both eyes daily as needed (CLEAR EYES). Dry Eyes      No current facility-administered medications for this visit.    Allergies as of 10/30/2019  . (No Known Allergies)    No family history on file.  Social History   Socioeconomic History  . Marital status: Married    Spouse name: Not on file  . Number of children: Not on file  . Years of education: Not on file  . Highest education level: Not on file  Occupational History  . Not on file  Tobacco Use  . Smoking status: Current Every Day Smoker    Packs/day: 0.50    Years: 0.50    Pack years: 0.25    Types: Cigarettes    . Smokeless tobacco: Never Used  Substance and Sexual Activity  . Alcohol use: Yes    Comment: occassional beer  . Drug use: No  . Sexual activity: Not on file  Other Topics Concern  . Not on file  Social History Narrative  . Not on file   Social Determinants of Health   Financial Resource Strain:   . Difficulty of Paying Living Expenses: Not on file  Food Insecurity:   . Worried About Charity fundraiser in the Last Year: Not on file  . Ran Out of Food in the Last Year: Not on file  Transportation Needs:   . Lack of Transportation (Medical): Not on file  . Lack of Transportation (Non-Medical): Not on file  Physical Activity:   . Days of Exercise per Week: Not on file  . Minutes of Exercise per Session: Not on file  Stress:   . Feeling of Stress : Not on file  Social Connections:   . Frequency of Communication with Friends and Family: Not on file  . Frequency of Social Gatherings with Friends and Family: Not on file  . Attends Religious Services: Not on file  . Active Member of Clubs or Organizations: Not on file  . Attends Archivist Meetings: Not on file  . Marital  Status: Not on file    Review of Systems: Gen: Denies fever, chills, anorexia. Denies fatigue, weakness, weight loss.  CV: Denies chest pain, palpitations, syncope, peripheral edema, and claudication. Resp: Denies dyspnea at rest, cough, wheezing, coughing up blood, and pleurisy. GI: Denies vomiting blood, jaundice, and fecal incontinence.   Denies dysphagia or odynophagia. Derm: Denies rash, itching, dry skin Psych: Denies depression, anxiety, memory loss, confusion. No homicidal or suicidal ideation.  Heme: Denies bruising, bleeding, and enlarged lymph nodes.  Physical Exam: There were no vitals taken for this visit. General:   Alert and oriented. No distress noted. Pleasant and cooperative.  Head:  Normocephalic and atraumatic. Eyes:  Conjuctiva clear without scleral icterus. Mouth:  Oral  mucosa pink and moist. Good dentition. No lesions. Heart:  S1, S2 present without murmurs appreciated. Lungs:  Clear to auscultation bilaterally. No wheezes, rales, or rhonchi. No distress.  Abdomen:  +BS, soft, non-tender and non-distended. No rebound or guarding. No HSM or masses noted. Msk:  Symmetrical without gross deformities. Normal posture. Extremities:  Without edema. Neurologic:  Alert and  oriented x4 Psych:  Alert and cooperative. Normal mood and affect.

## 2019-10-30 ENCOUNTER — Telehealth: Payer: Self-pay | Admitting: Gastroenterology

## 2019-10-30 ENCOUNTER — Ambulatory Visit: Payer: BC Managed Care – PPO | Admitting: Gastroenterology

## 2019-10-30 ENCOUNTER — Encounter: Payer: Self-pay | Admitting: Gastroenterology

## 2019-10-30 NOTE — Telephone Encounter (Signed)
Patient was a no show and letter sent  °

## 2019-10-30 NOTE — Telephone Encounter (Signed)
Noted  

## 2019-11-14 NOTE — Progress Notes (Signed)
Referring Provider: Rosita Fire, MD Primary Care Physician:  Rosita Fire, MD Primary GI Physician: Dr. Oneida Alar  Chief Complaint  Patient presents with  . Elevated Hepatic Enzymes    HPI:   Angel Powell is a 64 y.o. male presenting today at the request of  Rosita Fire, MD for abnormal LFTs.  Patient has never been seen in our office.  He was triage for colonoscopy back in February 2019 but canceled his procedure.   Per chart review, labs completed in December 2020 with AST 61H, ALT 48H, alk phos and total bilirubin normal at 64 and 0.9 respectively.  Hepatitis C antibody negative, hepatitis B surface antibody and surface antigen nonreactive.  CBC with WBC 2.8L, hemoglobin 13.8, platelets 110L.  No recent abdominal imaging on file.   Today: No GI complaints at this time. No abdominal pain. Milk will cause abdominal cramping at times. BMs daily. No blood in the stool. No black stools. No constipation. May have diarrhea depending on what he eats. Not a regular thing. No nausea or vomiting. Taking omeprazole for reflux most days. Has been on omeprazole for years. Will continue with reflux during the day at times. Eating a lot of fried chicken and burgers.  Occasional soda. Eats within 3 hours of laying down at times. Works 3rd shift. May come home, eat breakfast, then lay down. Hasn't paid attention to food triggers. No dysphagia. No unintentional weight loss.   No swelling his abdomen or lower legs. No yellowing of his eyes. No confusion. No bruising or bleeding. No dark urine. Alcohol: 2-3 beer every other day. No IV or intranasal drugs. Shot of liquor every other day. Denies ever drinking heavier in the past. No personal autoimmune conditions. No family history of liver disease, alpha-1 antitrypsin deficiency, wilson's disease, hemochromatosis, celiac disease, thyroid disorders, IBD.   Taking Tylenol every other day for bulging disc. Takes 500 mg a day.  Occasional ibuprofen when  not taking Tylenol. 400 mg a couple times a week.  No other OTC supplements or herbal teas.   Last colonoscopy 2008.  Normal exam. Only with small internal hemorrhoids.     Past Medical History:  Diagnosis Date  . GERD (gastroesophageal reflux disease)   . Hypercholesteremia     Past Surgical History:  Procedure Laterality Date  . bone spur     right  . COLONOSCOPY  02/10/2007   Small internal hemorrhoids.  Otherwise normal colon.  Repeat in 10 years.  Marland Kitchen ROTATOR CUFF REPAIR     left, [facility]; [place]    Current Outpatient Medications  Medication Sig Dispense Refill  . atorvastatin (LIPITOR) 20 MG tablet Take 20 mg by mouth daily.    Marland Kitchen omeprazole (PRILOSEC) 40 MG capsule Take 40 mg by mouth daily as needed. Acid Reflux    . tetrahydrozoline 0.05 % ophthalmic solution Place 1 drop into both eyes daily as needed (CLEAR EYES). Dry Eyes      No current facility-administered medications for this visit.    Allergies as of 11/15/2019  . (No Known Allergies)    Family History  Problem Relation Age of Onset  . Colon cancer Neg Hx     Social History   Socioeconomic History  . Marital status: Married    Spouse name: Not on file  . Number of children: Not on file  . Years of education: Not on file  . Highest education level: Not on file  Occupational History  . Not on file  Tobacco  Use  . Smoking status: Current Every Day Smoker    Packs/day: 0.50    Years: 0.50    Pack years: 0.25    Types: Cigarettes  . Smokeless tobacco: Never Used  Substance and Sexual Activity  . Alcohol use: Yes    Comment: 2-3 beer every other day and a shot of liquor every other day  . Drug use: No  . Sexual activity: Not on file  Other Topics Concern  . Not on file  Social History Narrative  . Not on file   Social Determinants of Health   Financial Resource Strain:   . Difficulty of Paying Living Expenses: Not on file  Food Insecurity:   . Worried About Charity fundraiser in the Last  Year: Not on file  . Ran Out of Food in the Last Year: Not on file  Transportation Needs:   . Lack of Transportation (Medical): Not on file  . Lack of Transportation (Non-Medical): Not on file  Physical Activity:   . Days of Exercise per Week: Not on file  . Minutes of Exercise per Session: Not on file  Stress:   . Feeling of Stress : Not on file  Social Connections:   . Frequency of Communication with Friends and Family: Not on file  . Frequency of Social Gatherings with Friends and Family: Not on file  . Attends Religious Services: Not on file  . Active Member of Clubs or Organizations: Not on file  . Attends Archivist Meetings: Not on file  . Marital Status: Not on file    Review of Systems: Gen: Denies fever, chills, lightheadedness, dizziness, presyncope, or syncope. HEENT: No cold or flu like symptoms.  CV: Denies chest pain or palpitations Resp: Denies shortness of breath . Occasional chronic cough.  GI: See HPI Derm: Denies rash Psych: Denies depression or anxiety  Heme: See HPI  Physical Exam: BP (!) 155/93   Pulse 97   Temp 97.8 F (36.6 C)   Ht '5\' 9"'  (1.753 m)   Wt 185 lb 9.6 oz (84.2 kg)   BMI 27.41 kg/m  General:   Alert and oriented. No distress noted. Pleasant and cooperative.  Head:  Normocephalic and atraumatic. Eyes:  Conjuctiva clear without scleral icterus. Heart:  S1, S2 present without murmurs appreciated. Lungs:  Clear to auscultation bilaterally. No wheezes, rales, or rhonchi. No distress.  Abdomen:  +BS, soft, non-tender and non-distended. No rebound or guarding. No HSM or masses noted. Msk:  Symmetrical without gross deformities. Normal posture. Extremities:  Without edema. Neurologic:  Alert and  oriented x4 Psych:  Normal mood and affect.

## 2019-11-15 ENCOUNTER — Encounter: Payer: Self-pay | Admitting: Gastroenterology

## 2019-11-15 ENCOUNTER — Other Ambulatory Visit: Payer: Self-pay

## 2019-11-15 ENCOUNTER — Ambulatory Visit: Payer: BC Managed Care – PPO | Admitting: Gastroenterology

## 2019-11-15 DIAGNOSIS — Z1211 Encounter for screening for malignant neoplasm of colon: Secondary | ICD-10-CM | POA: Diagnosis not present

## 2019-11-15 DIAGNOSIS — R7989 Other specified abnormal findings of blood chemistry: Secondary | ICD-10-CM | POA: Diagnosis not present

## 2019-11-15 DIAGNOSIS — K219 Gastro-esophageal reflux disease without esophagitis: Secondary | ICD-10-CM | POA: Diagnosis not present

## 2019-11-15 NOTE — Assessment & Plan Note (Addendum)
65 year old male with history of HLD and GERD presenting for further evaluation of elevated LFTs.  Labs completed in December 2020 with AST 61H, ALT 48H, alk phos and total bilirubin normal at 64 and 0.9 respectively.  Hepatitis C antibody negative, hepatitis B surface antibody and surface antigen nonreactive.  CBC with low platelets at 110, Hemoglobin normal at 13.8.  He is without any signs or symptoms of advanced liver disease.  Admits to drinking 2-3 beer and a shot of liquor every other day.  No IV or intranasal drug use.  Takes 500 mg Tylenol every other day for back pain.  No OTC supplements or herbal teas.  No personal history of autoimmune conditions.  No family history of liver disease, alpha-1 antitrypsin deficiency, Wilson's disease, hemochromatosis, celiac disease, thyroid disorders, or IBD.  Suspect LFT elevation is likely secondary to regular alcohol use, possible alcoholic fatty liver disease.   Update HFP Iron panel with ferritin INR Abdominal ultrasound Check hepatitis A antibody to evaluate for immunity. He will need hepatitis B vaccine.  Will await for lab results as he will likely need hepatitis A as well. Advised to work towards abstinence of alcohol. Limit Tylenol. Further recommendations following lab and ultrasound results. Follow-up in 3 months.

## 2019-11-15 NOTE — Patient Instructions (Addendum)
Have labs and ultrasound of your abdomen completed.  We will get you scheduled for colonoscopy in the near future with Dr. Darrick Penna.  Work towards abstinence of alcohol.  I suspect your elevated liver numbers are likely related to regular alcohol consumption.  Limit Tylenol.  No more than 2 g daily.  Continue taking omeprazole 40 mg daily.  Be sure to take this 30 minutes before breakfast.  Avoid fried, fatty, greasy, spicy, citrus foods.  Avoid caffeine, carbonated beverages, and chocolate.  Be sure you are not eating within 30 minutes of laying down.  We will plan to follow-up with you in 3-4 months.  Call if you have questions or concerns prior.  Ermalinda Memos, PA-C [facility]   Food Choices for Gastroesophageal Reflux Disease, Adult When you have gastroesophageal reflux disease (GERD), the foods you eat and your eating habits are very important. Choosing the right foods can help ease the discomfort of GERD. Consider working with a diet and nutrition specialist (dietitian) to help you make healthy food choices. What general guidelines should I follow?  Eating plan  Choose healthy foods low in fat, such as fruits, vegetables, whole grains, low-fat dairy products, and lean meat, fish, and poultry.  Eat frequent, small meals instead of three large meals each day. Eat your meals slowly, in a relaxed setting. Avoid bending over or lying down until 2-3 hours after eating.  Limit high-fat foods such as fatty meats or fried foods.  Limit your intake of oils, butter, and shortening to less than 8 teaspoons each day.  Avoid the following: ? Foods that cause symptoms. These may be different for different people. Keep a food diary to keep track of foods that cause symptoms. ? Alcohol. ? Drinking large amounts of liquid with meals. ? Eating meals during the 2-3 hours before bed.  Cook foods using methods other than frying. This may include baking, grilling, or  broiling. Lifestyle  Maintain a healthy weight. Ask your health care provider what weight is healthy for you. If you need to lose weight, work with your health care provider to do so safely.  Exercise for at least 30 minutes on 5 or more days each week, or as told by your health care provider.  Avoid wearing clothes that fit tightly around your waist and chest.  Do not use any products that contain nicotine or tobacco, such as cigarettes and e-cigarettes. If you need help quitting, ask your health care provider.  Sleep with the head of your bed raised. Use a wedge under the mattress or blocks under the bed frame to raise the head of the bed. What foods are not recommended? The items listed may not be a complete list. Talk with your dietitian about what dietary choices are best for you. Grains Pastries or quick breads with added fat. [place] toast. Vegetables Deep fried vegetables. [place] fries. Any vegetables prepared with added fat. Any vegetables that cause symptoms. For some people this may include tomatoes and tomato products, chili peppers, onions and garlic, and horseradish. Fruits Any fruits prepared with added fat. Any fruits that cause symptoms. For some people this may include citrus fruits, such as oranges, grapefruit, pineapple, and lemons. Meats and other protein foods High-fat meats, such as fatty beef or pork, hot dogs, ribs, ham, sausage, salami and bacon. Fried meat or protein, including fried fish and fried chicken. Nuts and nut butters. Dairy Whole milk and chocolate milk. Sour cream. Cream. Ice cream. Cream cheese. Milk shakes. Beverages Coffee  and tea, with or without caffeine. Carbonated beverages. Sodas. Energy drinks. Fruit juice made with acidic fruits (such as orange or grapefruit). Tomato juice. Alcoholic drinks. Fats and oils Butter. Margarine. Shortening. Ghee. Sweets and desserts Chocolate and cocoa. Donuts. Seasoning and other foods Pepper. Peppermint and  spearmint. Any condiments, herbs, or seasonings that cause symptoms. For some people, this may include curry, hot sauce, or vinegar-based salad dressings. Summary  When you have gastroesophageal reflux disease (GERD), food and lifestyle choices are very important to help ease the discomfort of GERD.  Eat frequent, small meals instead of three large meals each day. Eat your meals slowly, in a relaxed setting. Avoid bending over or lying down until 2-3 hours after eating.  Limit high-fat foods such as fatty meat or fried foods. This information is not intended to replace advice given to you by your health care provider. Make sure you discuss any questions you have with your health care provider. Document Revised: 01/26/2019 Document Reviewed: 10/06/2016 Elsevier Patient Education  [place].

## 2019-11-15 NOTE — Assessment & Plan Note (Signed)
Chronic.  Currently on omeprazole 40 mg but not taking this at any particular time of the day.  Occasionally will have breakthrough symptoms with omeprazole.  I suspect this is likely dietary/lifestyle related as Angel Powell admits to eating a lot of fried foods and burgers.  Occasional soda.  Often eats within 3 hours of laying down due to work schedule.  No alarm symptoms.  Angel Powell was advised to continue taking omeprazole 40 mg daily and ensure Angel Powell takes this 30 minutes before his first meal. Advised to avoid fried, fatty, greasy, spicy, citrus foods.  Avoid caffeine, carbonated beverages, and chocolate.  Avoid eating within 3 hours of laying down. GERD handout provided. Follow-up in 3-4 months for elevated LFTs.

## 2019-11-15 NOTE — Assessment & Plan Note (Signed)
64 year old male who is overdue for colon cancer screening.  Last colonoscopy in 2008 with normal colon, small internal hemorrhoids.  Currently without any significant upper or lower GI symptoms.  No bright red blood per rectum, melena, or unintentional weight loss.  No family history of colon cancer.  Proceed with TCS with propofol with Dr. Darrick Penna in the near future. The risks, benefits, and alternatives have been discussed in detail with patient. They have stated understanding and desire to proceed.  Propofol due to alcohol. Follow-up in 3-4 months for elevated LFTs.

## 2019-11-15 NOTE — Progress Notes (Signed)
CC'ED TO PCP 

## 2019-11-16 LAB — HEPATIC FUNCTION PANEL
AG Ratio: 1.7 (calc) (ref 1.0–2.5)
ALT: 53 U/L — ABNORMAL HIGH (ref 9–46)
AST: 52 U/L — ABNORMAL HIGH (ref 10–35)
Albumin: 4.6 g/dL (ref 3.6–5.1)
Alkaline phosphatase (APISO): 67 U/L (ref 35–144)
Bilirubin, Direct: 0.2 mg/dL (ref 0.0–0.2)
Globulin: 2.7 g/dL (calc) (ref 1.9–3.7)
Indirect Bilirubin: 0.3 mg/dL (calc) (ref 0.2–1.2)
Total Bilirubin: 0.5 mg/dL (ref 0.2–1.2)
Total Protein: 7.3 g/dL (ref 6.1–8.1)

## 2019-11-16 LAB — IRON,TIBC AND FERRITIN PANEL
%SAT: 43 % (calc) (ref 20–48)
Ferritin: 1304 ng/mL — ABNORMAL HIGH (ref 24–380)
Iron: 87 ug/dL (ref 50–180)
TIBC: 202 mcg/dL (calc) — ABNORMAL LOW (ref 250–425)

## 2019-11-16 LAB — HEPATITIS A ANTIBODY, TOTAL: Hepatitis A AB,Total: NONREACTIVE

## 2019-11-16 LAB — PROTIME-INR
INR: 1
Prothrombin Time: 10.5 s (ref 9.0–11.5)

## 2019-11-16 NOTE — Progress Notes (Signed)
LFTs still mildly elevated but stable at AST 52 and ALT 53. INR normal. Ferritin quite elevated at 1,304.   We need to evaluate for hemochromatosis. Doris, please arrange for hemochromatosis labs unless they can be added on.   Waiting on Hepatitis A Ab to result and [place] to be completed.

## 2019-11-16 NOTE — Progress Notes (Signed)
Hepatitis A Ab total non-reactive. This means he doesn't have immunity to hepatitis A. He also doesn't have immunity to hepatitis B (based on prior lab results). He needs vaccination against both Hep A and Hep B and can have this completed with his PCP.

## 2019-11-17 ENCOUNTER — Other Ambulatory Visit: Payer: Self-pay | Admitting: *Deleted

## 2019-11-17 ENCOUNTER — Encounter: Payer: Self-pay | Admitting: *Deleted

## 2019-11-17 MED ORDER — PEG 3350-KCL-NA BICARB-NACL 420 G PO SOLR: 4000.0000 mL | Freq: Once | ORAL | 0 refills | Status: AC

## 2019-11-20 ENCOUNTER — Other Ambulatory Visit: Payer: Self-pay

## 2019-11-20 ENCOUNTER — Telehealth: Payer: Self-pay

## 2019-11-20 ENCOUNTER — Encounter: Payer: Self-pay | Admitting: *Deleted

## 2019-11-20 DIAGNOSIS — R7989 Other specified abnormal findings of blood chemistry: Secondary | ICD-10-CM

## 2019-11-20 NOTE — Telephone Encounter (Signed)
TCS w/Prop needs to be scheduled w/SL[facility]. Procedure for 02/15/20 needs to be rescheduled. Tried to call pt, no answer, unable to leave message d/t mailbox is full.

## 2019-11-22 ENCOUNTER — Ambulatory Visit (HOSPITAL_COMMUNITY): Payer: BC Managed Care – PPO

## 2019-11-22 NOTE — Telephone Encounter (Signed)
Tried to call pt again, no answer, unable to leave VM d/t mailbox is full. Letter mailed.

## 2019-11-30 ENCOUNTER — Other Ambulatory Visit: Payer: Self-pay

## 2019-11-30 ENCOUNTER — Ambulatory Visit (HOSPITAL_COMMUNITY)
Admission: RE | Admit: 2019-11-30 | Discharge: 2019-11-30 | Disposition: A | Discharge status: Home / Self Care | Payer: BC Managed Care – PPO | Source: Ambulatory Visit | Attending: Gastroenterology | Admitting: Gastroenterology

## 2019-11-30 DIAGNOSIS — R7989 Other specified abnormal findings of blood chemistry: Secondary | ICD-10-CM | POA: Insufficient documentation

## 2019-12-18 ENCOUNTER — Telehealth: Payer: Self-pay | Admitting: Gastroenterology

## 2019-12-18 NOTE — Telephone Encounter (Signed)
Angel Powell, as patient's colonoscopy has been rescheduled, I would like to move his follow-up office visit from 02/15/2020 to the third or fourth week of May (after his colonoscopy scheduled for 02/29/2020).

## 2019-12-19 NOTE — Telephone Encounter (Signed)
Noted  

## 2019-12-19 NOTE — Telephone Encounter (Signed)
CALLED PATIENT AND RESCHEDULED HIS APPOINTMENT

## 2020-02-06 ENCOUNTER — Encounter: Payer: Self-pay | Admitting: Gastroenterology

## 2020-02-13 ENCOUNTER — Other Ambulatory Visit (HOSPITAL_COMMUNITY): Payer: BC Managed Care – PPO

## 2020-02-15 ENCOUNTER — Ambulatory Visit (HOSPITAL_COMMUNITY): Admit: 2020-02-15 | Payer: BC Managed Care – PPO | Admitting: Gastroenterology

## 2020-02-15 ENCOUNTER — Ambulatory Visit: Payer: BC Managed Care – PPO | Admitting: Gastroenterology

## 2020-02-15 ENCOUNTER — Encounter (HOSPITAL_COMMUNITY): Payer: Self-pay

## 2020-02-15 SURGERY — COLONOSCOPY WITH PROPOFOL
Anesthesia: Monitor Anesthesia Care

## 2020-02-27 ENCOUNTER — Other Ambulatory Visit (HOSPITAL_COMMUNITY): Payer: BC Managed Care – PPO

## 2020-02-27 ENCOUNTER — Telehealth: Payer: Self-pay

## 2020-02-27 ENCOUNTER — Other Ambulatory Visit (HOSPITAL_COMMUNITY)
Admission: RE | Admit: 2020-02-27 | Discharge: 2020-02-27 | Disposition: A | Discharge status: Home / Self Care | Payer: BC Managed Care – PPO | Source: Ambulatory Visit | Attending: Internal Medicine | Admitting: Internal Medicine

## 2020-02-27 ENCOUNTER — Encounter (HOSPITAL_COMMUNITY): Admission: RE | Admit: 2020-02-27 | Payer: BC Managed Care – PPO | Source: Ambulatory Visit

## 2020-02-27 NOTE — Telephone Encounter (Signed)
Noted  

## 2020-02-27 NOTE — Telephone Encounter (Signed)
Pt called office requesting to cancel TCS scheduled for 02/29/20. States he has to work. Hasn't got prep. He also missed pre-op appt this am. Last OV was 10/2019. Pt already scheduled for OV 03/25/20. Reminded him of upcoming OV.   LMOVM for endo scheduler to cancel procedure.  FYI to [facility].

## 2020-02-29 ENCOUNTER — Encounter (HOSPITAL_COMMUNITY): Admission: RE | Payer: Self-pay | Source: Home / Self Care

## 2020-02-29 ENCOUNTER — Ambulatory Visit (HOSPITAL_COMMUNITY)
Admission: RE | Admit: 2020-02-29 | Payer: BC Managed Care – PPO | Source: Home / Self Care | Admitting: Internal Medicine

## 2020-02-29 SURGERY — COLONOSCOPY WITH PROPOFOL
Anesthesia: Monitor Anesthesia Care

## 2020-03-20 ENCOUNTER — Ambulatory Visit: Payer: BC Managed Care – PPO | Admitting: Gastroenterology

## 2020-03-23 NOTE — Progress Notes (Deleted)
Referring Provider: Rosita Fire, MD Primary Care Physician:  Rosita Fire, MD Primary GI Physician: Dr. Oneida Alar (Dr. Gala Romney following for now)  No chief complaint on file.   HPI:   Angel Powell is a 64 y.o. male presenting today for follow-up of elevated LFTs, GERD, and colon cancer screening.   Last seen in our office on 11/15/2019 the time of initial consult for the same.  Prior labs in December 2020 with AST 61H, ALT 48H, alk phos and total bilirubin normal.  Hepatitis C antibody negative, hepatitis B surface antibody and surface antigen nonreactive.  Hemoglobin within normal limits but platelets low at 110.  He was without signs or symptoms of advanced liver disease.  Admitted to drinking 2-3 beer and a shot of liquor every other day.  No IV or intranasal drug use.  500 mg of Tylenol every other day for back pain.  No OTC supplements or herbal teas.  Suspected LFT elevation was likely secondary to regular alcohol use, possible alcoholic fatty liver disease.  Plan to update HFP, iron panel, INR, abdominal ultrasound, limit Tylenol, work towards alcohol abstinence.  Also noted chronic history of GERD with occasional breakthrough symptoms on omeprazole 40 mg daily.  Suspected breakthrough symptoms were secondary to dietary habits and not taking omeprazole 30 minutes before first meal.  Counseled on a GERD diet and most appropriate time to take omeprazole.  Additionally, he was already for colon cancer screening with last colonoscopy in 2008.  He was scheduled for colonoscopy.  Labs 11/15/2019: AST 52, ALT 53, INR normal, ferritin elevated at 1304, percent saturation upper limit of normal at 43, total iron 87.  No immunity to hepatitis A.  He was advised to receive hep A/B vaccine with PCP.  Labs for hemochromatosis DNA were ordered but never completed. Ultrasound abdomen 11/30/2019: Liver appeared normal.  Incidental splenic cyst.  Patient canceled his colonoscopy.  Today:   Past Medical  History:  Diagnosis Date  . GERD (gastroesophageal reflux disease)   . Hypercholesteremia     Past Surgical History:  Procedure Laterality Date  . bone spur     right  . COLONOSCOPY  02/10/2007   Small internal hemorrhoids.  Otherwise normal colon.  Repeat in 10 years.  Marland Kitchen ROTATOR CUFF REPAIR     left, [facility]; [place]    Current Outpatient Medications  Medication Sig Dispense Refill  . acetaminophen (TYLENOL) 500 MG tablet Take 500-1,000 mg by mouth every 6 (six) hours as needed (back pain.).    Marland Kitchen atorvastatin (LIPITOR) 20 MG tablet Take 20 mg by mouth daily.    . Menthol, Topical Analgesic, (BIOFREEZE EX) Apply 1 application topically 3 (three) times daily as needed (back pain.).    Marland Kitchen naphazoline-glycerin (CLEAR EYES REDNESS) 0.012-0.2 % SOLN Place 1 drop into both eyes 4 (four) times daily as needed for eye irritation.    Marland Kitchen omeprazole (PRILOSEC) 40 MG capsule Take 40 mg by mouth daily.      No current facility-administered medications for this visit.    Allergies as of 03/25/2020  . (No Known Allergies)    Family History  Problem Relation Age of Onset  . Colon cancer Neg Hx     Social History   Socioeconomic History  . Marital status: Married    Spouse name: Not on file  . Number of children: Not on file  . Years of education: Not on file  . Highest education level: Not on file  Occupational History  .  Not on file  Tobacco Use  . Smoking status: Current Every Day Smoker    Packs/day: 0.50    Years: 0.50    Pack years: 0.25    Types: Cigarettes  . Smokeless tobacco: Never Used  Substance and Sexual Activity  . Alcohol use: Yes    Comment: 2-3 beer every other day and a shot of liquor every other day  . Drug use: No  . Sexual activity: Not on file  Other Topics Concern  . Not on file  Social History Narrative  . Not on file   Social Determinants of Health   Financial Resource Strain:   . Difficulty of Paying Living Expenses:   Food Insecurity:   .  Worried About Charity fundraiser in the Last Year:   . Arboriculturist in the Last Year:   Transportation Needs:   . Film/video editor (Medical):   Marland Kitchen Lack of Transportation (Non-Medical):   Physical Activity:   . Days of Exercise per Week:   . Minutes of Exercise per Session:   Stress:   . Feeling of Stress :   Social Connections:   . Frequency of Communication with Friends and Family:   . Frequency of Social Gatherings with Friends and Family:   . Attends Religious Services:   . Active Member of Clubs or Organizations:   . Attends Archivist Meetings:   Marland Kitchen Marital Status:     Review of Systems: Gen: Denies fever, chills, anorexia. Denies fatigue, weakness, weight loss.  CV: Denies chest pain, palpitations, syncope, peripheral edema, and claudication. Resp: Denies dyspnea at rest, cough, wheezing, coughing up blood, and pleurisy. GI: Denies vomiting blood, jaundice, and fecal incontinence.   Denies dysphagia or odynophagia. Derm: Denies rash, itching, dry skin Psych: Denies depression, anxiety, memory loss, confusion. No homicidal or suicidal ideation.  Heme: Denies bruising, bleeding, and enlarged lymph nodes.  Physical Exam: There were no vitals taken for this visit. General:   Alert and oriented. No distress noted. Pleasant and cooperative.  Head:  Normocephalic and atraumatic. Eyes:  Conjuctiva clear without scleral icterus. Mouth:  Oral mucosa pink and moist. Good dentition. No lesions. Heart:  S1, S2 present without murmurs appreciated. Lungs:  Clear to auscultation bilaterally. No wheezes, rales, or rhonchi. No distress.  Abdomen:  +BS, soft, non-tender and non-distended. No rebound or guarding. No HSM or masses noted. Msk:  Symmetrical without gross deformities. Normal posture. Extremities:  Without edema. Neurologic:  Alert and  oriented x4 Psych:  Alert and cooperative. Normal mood and affect.

## 2020-03-25 ENCOUNTER — Ambulatory Visit: Payer: BC Managed Care – PPO | Admitting: Gastroenterology

## 2020-03-28 ENCOUNTER — Ambulatory Visit: Payer: BC Managed Care – PPO | Admitting: Gastroenterology

## 2020-04-17 ENCOUNTER — Other Ambulatory Visit (HOSPITAL_COMMUNITY): Payer: Self-pay | Admitting: Internal Medicine

## 2020-04-17 DIAGNOSIS — M79641 Pain in right hand: Secondary | ICD-10-CM

## 2020-04-18 ENCOUNTER — Other Ambulatory Visit: Payer: Self-pay

## 2020-04-18 ENCOUNTER — Ambulatory Visit (HOSPITAL_COMMUNITY)
Admission: RE | Admit: 2020-04-18 | Discharge: 2020-04-18 | Disposition: A | Discharge status: Home / Self Care | Payer: BC Managed Care – PPO | Source: Ambulatory Visit | Attending: Internal Medicine | Admitting: Internal Medicine

## 2020-04-18 DIAGNOSIS — M79641 Pain in right hand: Secondary | ICD-10-CM | POA: Diagnosis not present

## 2020-05-12 NOTE — Progress Notes (Deleted)
Referring Provider: Avon Gully, MD Primary Care Physician:  Avon Gully, MD Primary GI Physician: Dr. Darrick Penna  Chief Complaint  Patient presents with  . Gastroesophageal Reflux    HPI:   Angel Powell is a 64 y.o. male presenting today with a history of GERD and elevated LFTs with thrombocytopenia and frequent alcohol use presenting today for follow-up.  Notably, he is also overdue for colon cancer screening.  Last colonoscopy in 2008 with small internal hemorrhoids, otherwise normal exam.  Last seen in our office 11/15/2019.  He has been taking omeprazole for years for reflux.  Breakthrough symptoms occasionally.  Admitted to eating a lot of fried chicken and burgers.  Occasional soda.  No alarm symptoms.  No significant lower GI symptoms.  Occasional abdominal cramping with milk or diarrhea depending on what he has eaten but not regularly.  No bright red blood per rectum.  No signs or symptoms of decompensated liver disease.  Admitted to 2-3 beer/shot of liquor every other day.  No history of IV or intranasal use.  Also taking Tylenol every other day for bulging disc.  No OTC supplements or herbal teas.  Prior laboratory evaluation with PCP in December 2020 with AST 61, ALT 48, hep B and hep C negative. Plan included: Continue omeprazole but ensure to take this 30 minutes before his first meal, follow a GERD diet, complete colonoscopy for colon cancer screening, and update labs and ultrasound to further evaluate elevated LFTs.  We also discussed the importance of working towards abstinence of alcohol limiting Tylenol.   Labs 11/15/2019: AST 52 (H), ALT 53 (H) (down from AST 61 and ALT 48 in December 2020).  No immunity to hepatitis A.  INR 1.0.  Ferritin 1304 (H), iron 87, saturation 43%.  He was advised to complete labs for hemochromatosis and have hepatitis a and B vaccine with PCP. Hemochromatosis labs were never completed. Ultrasound abdomen complete 11/30/2019: Liver within normal  limits in parenchymal echogenicity.  There was a cyst arising from the posterior spleen.  This was discussed with radiology who stated cyst appears benign and likely an incidental finding without need to evaluate further. Patient cancelled TCS.   Today:  Elevated LFTs: Needs to complete hemochromatosis labs. Quit alcohol all together. Last drank in June 2021.   GERD: Taking omeprazole 40 mg in the afternoon around 6 pm. 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 Continues with symptoms daily. No dysaphia. No nausea or vomiting. No abdominal pain. Occasional fried foods. Rare soda.    Colon cancer screening: Ok with rescheduling. BMs daily. No brbpr or melena. No constipation or diarrhea.   Has gained some weight since he stopped drinking. Eating more.   ASA III  Past Medical History:  Diagnosis Date  . GERD (gastroesophageal reflux disease)   . Hypercholesteremia     Past Surgical History:  Procedure Laterality Date  . bone spur     right  . COLONOSCOPY  02/10/2007   Small internal hemorrhoids.  Otherwise normal colon.  Repeat in 10 years.  Marland Kitchen ROTATOR CUFF REPAIR     left, [facility]; [place]    Current Outpatient Medications  Medication Sig Dispense Refill  . acetaminophen (TYLENOL) 500 MG tablet Take 500-1,000 mg by mouth every 6 (six) hours as needed (back pain.).    Marland Kitchen atorvastatin (LIPITOR) 20 MG tablet Take 20 mg by mouth daily.    . Menthol, Topical Analgesic, (BIOFREEZE EX) Apply 1 application  topically 3 (three) times daily as needed (back pain.).    Marland Kitchen naphazoline-glycerin (CLEAR EYES REDNESS) 0.012-0.2 % SOLN Place 1 drop into both eyes 4 (four) times daily as needed for eye irritation.    Marland Kitchen omeprazole (PRILOSEC) 40 MG capsule Take 40 mg by mouth daily.      No current facility-administered medications for this visit.    Allergies as of 05/13/2020  . (No Known Allergies)    Family History  Problem Relation Age of Onset  . Colon cancer Neg Hx      Social History   Socioeconomic History  . Marital status: Married    Spouse name: Not on file  . Number of children: Not on file  . Years of education: Not on file  . Highest education level: Not on file  Occupational History  . Not on file  Tobacco Use  . Smoking status: Current Every Day Smoker    Packs/day: 0.50    Years: 0.50    Pack years: 0.25    Types: Cigarettes  . Smokeless tobacco: Never Used  Substance and Sexual Activity  . Alcohol use: Not Currently    Comment: 2-3 beer every other day and a shot of liquor every other day; 05/13/20 "I quit"  . Drug use: No  . Sexual activity: Not on file  Other Topics Concern  . Not on file  Social History Narrative  . Not on file   Social Determinants of Health   Financial Resource Strain:   . Difficulty of Paying Living Expenses:   Food Insecurity:   . Worried About Programme researcher, broadcasting/film/video in the Last Year:   . Barista in the Last Year:   Transportation Needs:   . Freight forwarder (Medical):   Marland Kitchen Lack of Transportation (Non-Medical):   Physical Activity:   . Days of Exercise per Week:   . Minutes of Exercise per Session:   Stress:   . Feeling of Stress :   Social Connections:   . Frequency of Communication with Friends and Family:   . Frequency of Social Gatherings with Friends and Family:   . Attends Religious Services:   . Active Member of Clubs or Organizations:   . Attends Banker Meetings:   Marland Kitchen Marital Status:     Review of Systems: Gen: Denies fever, chills, anorexia. Denies fatigue, weakness, weight loss.  CV: Denies chest pain, palpitations, syncope, peripheral edema, and claudication. Resp: Denies dyspnea at rest, cough, wheezing, coughing up blood, and pleurisy. GI: Denies vomiting blood, jaundice, and fecal incontinence.   Denies dysphagia or odynophagia. Derm: Denies rash, itching, dry skin Psych: Denies depression, anxiety, memory loss, confusion. No homicidal or suicidal  ideation.  Heme: Denies bruising, bleeding, and enlarged lymph nodes.  Physical Exam: BP (!) 146/81   Pulse 76   Temp (!) 97.2 F (36.2 C) (Oral)   Ht 5\' 9"  (1.753 m)   Wt 200 lb (90.7 kg)   BMI 29.53 kg/m  General:   Alert and oriented. No distress noted. Pleasant and cooperative.  Head:  Normocephalic and atraumatic. Eyes:  Conjuctiva clear without scleral icterus. Mouth:  Oral mucosa pink and moist. Good dentition. No lesions. Heart:  S1, S2 present without murmurs appreciated. Lungs:  Clear to auscultation bilaterally. No wheezes, rales, or rhonchi. No distress.  Abdomen:  +BS, soft, non-tender and non-distended. No rebound or guarding. No HSM or masses noted. Msk:  Symmetrical without gross deformities. Normal posture. Extremities:  Without  edema. Neurologic:  Alert and  oriented x4 Psych:  Alert and cooperative. Normal mood and affect.

## 2020-05-13 ENCOUNTER — Ambulatory Visit: Payer: BC Managed Care – PPO | Admitting: Gastroenterology

## 2020-05-13 ENCOUNTER — Other Ambulatory Visit: Payer: Self-pay

## 2020-05-13 ENCOUNTER — Encounter: Payer: Self-pay | Admitting: Gastroenterology

## 2020-05-13 VITALS — BP 146/81 | HR 76 | Temp 97.2°F | Ht 69.0 in | Wt 200.0 lb

## 2020-05-13 DIAGNOSIS — R7989 Other specified abnormal findings of blood chemistry: Secondary | ICD-10-CM

## 2020-05-13 DIAGNOSIS — K219 Gastro-esophageal reflux disease without esophagitis: Secondary | ICD-10-CM | POA: Diagnosis not present

## 2020-05-13 DIAGNOSIS — Z1211 Encounter for screening for malignant neoplasm of colon: Secondary | ICD-10-CM

## 2020-05-13 MED ORDER — PANTOPRAZOLE SODIUM 40 MG PO TBEC: 40.0000 mg | DELAYED_RELEASE_TABLET | Freq: Every day | ORAL | 5 refills | Status: DC

## 2020-05-13 NOTE — Progress Notes (Signed)
Referring Provider: Avon Gully, MD Primary Care Physician:  Avon Gully, MD Primary GI Physician: Dr. Darrick Penna (pending Dr. Marletta Lor); Dr. Jena Gauss following for now  Chief Complaint  Patient presents with  . Gastroesophageal Reflux    HPI:   Angel Powell is a 64 y.o. male presenting today with a history of GERD and elevated LFTs with thrombocytopenia and frequent alcohol use presenting today for follow-up.  Notably, he is also overdue for colon cancer screening.  Last colonoscopy in 2008 with small internal hemorrhoids, otherwise normal exam.  Last seen in our office 11/15/2019.  He has been taking omeprazole for years for reflux.  Breakthrough symptoms occasionally.  Admitted to eating a lot of fried chicken and burgers.  Occasional soda.  No alarm symptoms.  No significant lower GI symptoms.  Occasional abdominal cramping with milk or diarrhea depending on what he has eaten but not regularly.  No bright red blood per rectum.  No signs or symptoms of decompensated liver disease.  Admitted to 2-3 beer/shot of liquor every other day.  No history of IV or intranasal use.  Also taking Tylenol every other day for bulging disc.  No OTC supplements or herbal teas.  Prior laboratory evaluation with PCP in December 2020 with AST 61, ALT 48, hep B and hep C negative. Plan included: Continue omeprazole but ensure to take this 30 minutes before his first meal, follow a GERD diet, complete colonoscopy for colon cancer screening, and update labs and ultrasound to further evaluate elevated LFTs.  We also discussed the importance of working towards abstinence of alcohol limiting Tylenol.   Labs 11/15/2019: AST 52 (H), ALT 53 (H) (down from AST 61 and ALT 48 in December 2020).  No immunity to hepatitis A.  INR 1.0.  Ferritin 1304 (H), iron 87, saturation 43%.  He was advised to complete labs for hemochromatosis and have hepatitis a and B vaccine with PCP. Hemochromatosis labs were never completed. Ultrasound  abdomen complete 11/30/2019: Liver within normal limits in parenchymal echogenicity.  There was a cyst arising from the posterior spleen.  This was discussed with radiology who stated cyst appears benign and likely an incidental finding without need to evaluate further. Patient cancelled TCS.   Today:  Elevated LFTs: Needs to complete hemochromatosis labs. Quit alcohol all together. Last drank in June 2021. No abdominal swelling or lower extremity swelling, change in mental status, confusion, yellowing of the eyes, dark urine, bruising, or bleeding.  GERD: Taking omeprazole 40 mg in the afternoon around 6 pm. 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 Continues with symptoms daily. No dysaphia. No nausea or vomiting. No abdominal pain. Occasional fried foods. Rare soda.    Colon cancer screening: Ok with rescheduling TCS. BMs daily. No brbpr or melena. No constipation or diarrhea.   Has gained some weight since he stopped drinking.    Past Medical History:  Diagnosis Date  . GERD (gastroesophageal reflux disease)   . Hypercholesteremia     Past Surgical History:  Procedure Laterality Date  . bone spur     right  . COLONOSCOPY  02/10/2007   Small internal hemorrhoids.  Otherwise normal colon.  Repeat in 10 years.  Marland Kitchen ROTATOR CUFF REPAIR     left, [facility]; [place]    Current Outpatient Medications  Medication Sig Dispense Refill  . acetaminophen (TYLENOL) 500 MG tablet Take 500-1,000 mg by mouth every 6 (six) hours as needed (back pain.).    Marland Kitchen  atorvastatin (LIPITOR) 20 MG tablet Take 20 mg by mouth daily.    . Menthol, Topical Analgesic, (BIOFREEZE EX) Apply 1 application topically 3 (three) times daily as needed (back pain.).    Marland Kitchen naphazoline-glycerin (CLEAR EYES REDNESS) 0.012-0.2 % SOLN Place 1 drop into both eyes 4 (four) times daily as needed for eye irritation.    . pantoprazole (PROTONIX) 40 MG tablet Take 1 tablet (40 mg total) by mouth daily before  breakfast. 30 tablet 5  . predniSONE (STERAPRED UNI-PAK 21 TAB) 5 MG (21) TBPK tablet Take 6 pills first day; 5 pills second day; 4 pills third day; 3 pills fourth day; 2 pills next day and 1 pill last day. 21 tablet 0   No current facility-administered medications for this visit.    Allergies as of 05/13/2020  . (No Known Allergies)    Family History  Problem Relation Age of Onset  . Colon cancer Neg Hx     Social History   Socioeconomic History  . Marital status: Married    Spouse name: Not on file  . Number of children: Not on file  . Years of education: Not on file  . Highest education level: Not on file  Occupational History  . Not on file  Tobacco Use  . Smoking status: Current Every Day Smoker    Packs/day: 0.50    Years: 0.50    Pack years: 0.25    Types: Cigarettes  . Smokeless tobacco: Never Used  Substance and Sexual Activity  . Alcohol use: Not Currently    Comment: 2-3 beer every other day and a shot of liquor every other day; 05/13/20 "I quit"- last drank in June 2021  . Drug use: No  . Sexual activity: Not on file  Other Topics Concern  . Not on file  Social History Narrative  . Not on file   Social Determinants of Health   Financial Resource Strain:   . Difficulty of Paying Living Expenses:   Food Insecurity:   . Worried About Programme researcher, broadcasting/film/video in the Last Year:   . Barista in the Last Year:   Transportation Needs:   . Freight forwarder (Medical):   Marland Kitchen Lack of Transportation (Non-Medical):   Physical Activity:   . Days of Exercise per Week:   . Minutes of Exercise per Session:   Stress:   . Feeling of Stress :   Social Connections:   . Frequency of Communication with Friends and Family:   . Frequency of Social Gatherings with Friends and Family:   . Attends Religious Services:   . Active Member of Clubs or Organizations:   . Attends Banker Meetings:   Marland Kitchen Marital Status:     Review of Systems: Gen: Denies  fever, chills, cold or flulike symptoms, lightheadedness, dizziness, presyncope, syncope. CV: Denies chest pain or palpitations. Resp: Denies dyspnea or cough.Marland Kitchen GI: See HPI  Heme: See HPI  Physical Exam: BP (!) 146/81   Pulse 76   Temp (!) 97.2 F (36.2 C) (Oral)   Ht 5\' 9"  (1.753 m)   Wt 200 lb (90.7 kg)   BMI 29.53 kg/m  General:   Alert and oriented. No distress noted. Pleasant and cooperative.  Head:  Normocephalic and atraumatic. Eyes:  Conjuctiva clear without scleral icterus. Heart:  S1, S2 present without murmurs appreciated. Lungs:  Clear to auscultation bilaterally. No wheezes, rales, or rhonchi. No distress.  Abdomen:  +BS, soft, non-tender and non-distended. No  rebound or guarding. No HSM or masses noted. Msk:  Symmetrical without gross deformities. Normal posture. Extremities:  Without edema. Neurologic:  Alert and  oriented x4 Psych: Normal mood and affect.

## 2020-05-13 NOTE — Patient Instructions (Addendum)
Please have labs completed at [facility] lab or [facility].   We will get you scheduled for colonoscopy in the near future with Dr. Jena Gauss.   Please stop omeprazole and start Protonix 40 mg daily 30 minutes before your first meal of the day.  Follow a GERD diet/lifestyle:  Avoid fried, fatty, greasy, spicy, citrus foods. Avoid caffeine and carbonated beverages. Avoid chocolate. Try eating 4-6 small meals a day rather than 3 large meals. Do not eat within 3 hours of laying down. Prop head of bed up on wood or bricks to create a 6 inch incline.  Avoid all NSAIDs including ibuprofen, Aleve, Advil, Goody powders, naproxen, and anything that says "NSAID" on the package.  It is okay to take Tylenol as needed.  No more than 2000 mg in 24 hours.  Continue to avoid alcohol.  Follow-up in 4 months.  Do not hesitate to call with questions or concerns prior.  Ermalinda Memos, PA-C [facility]   Food Choices for Gastroesophageal Reflux Disease, Adult When you have gastroesophageal reflux disease (GERD), the foods you eat and your eating habits are very important. Choosing the right foods can help ease the discomfort of GERD. Consider working with a diet and nutrition specialist (dietitian) to help you make healthy food choices. What general guidelines should I follow?  Eating plan  Choose healthy foods low in fat, such as fruits, vegetables, whole grains, low-fat dairy products, and lean meat, fish, and poultry.  Eat frequent, small meals instead of three large meals each day. Eat your meals slowly, in a relaxed setting. Avoid bending over or lying down until 2-3 hours after eating.  Limit high-fat foods such as fatty meats or fried foods.  Limit your intake of oils, butter, and shortening to less than 8 teaspoons each day.  Avoid the following: ? Foods that cause symptoms. These may be different for different people. Keep a food diary to keep track of foods that cause  symptoms. ? Alcohol. ? Drinking large amounts of liquid with meals. ? Eating meals during the 2-3 hours before bed.  Cook foods using methods other than frying. This may include baking, grilling, or broiling. Lifestyle  Maintain a healthy weight. Ask your health care provider what weight is healthy for you. If you need to lose weight, work with your health care provider to do so safely.  Exercise for at least 30 minutes on 5 or more days each week, or as told by your health care provider.  Avoid wearing clothes that fit tightly around your waist and chest.  Do not use any products that contain nicotine or tobacco, such as cigarettes and e-cigarettes. If you need help quitting, ask your health care provider.  Sleep with the head of your bed raised. Use a wedge under the mattress or blocks under the bed frame to raise the head of the bed. What foods are not recommended? The items listed may not be a complete list. Talk with your dietitian about what dietary choices are best for you. Grains Pastries or quick breads with added fat. [place] toast. Vegetables Deep fried vegetables. [place] fries. Any vegetables prepared with added fat. Any vegetables that cause symptoms. For some people this may include tomatoes and tomato products, chili peppers, onions and garlic, and horseradish. Fruits Any fruits prepared with added fat. Any fruits that cause symptoms. For some people this may include citrus fruits, such as oranges, grapefruit, pineapple, and lemons. Meats and other protein foods High-fat meats, such as fatty  beef or pork, hot dogs, ribs, ham, sausage, salami and bacon. Fried meat or protein, including fried fish and fried chicken. Nuts and nut butters. Dairy Whole milk and chocolate milk. Sour cream. Cream. Ice cream. Cream cheese. Milk shakes. Beverages Coffee and tea, with or without caffeine. Carbonated beverages. Sodas. Energy drinks. Fruit juice made with acidic fruits (such as orange  or grapefruit). Tomato juice. Alcoholic drinks. Fats and oils Butter. Margarine. Shortening. Ghee. Sweets and desserts Chocolate and cocoa. Donuts. Seasoning and other foods Pepper. Peppermint and spearmint. Any condiments, herbs, or seasonings that cause symptoms. For some people, this may include curry, hot sauce, or vinegar-based salad dressings. Summary  When you have gastroesophageal reflux disease (GERD), food and lifestyle choices are very important to help ease the discomfort of GERD.  Eat frequent, small meals instead of three large meals each day. Eat your meals slowly, in a relaxed setting. Avoid bending over or lying down until 2-3 hours after eating.  Limit high-fat foods such as fatty meat or fried foods. This information is not intended to replace advice given to you by your health care provider. Make sure you discuss any questions you have with your health care provider. Document Revised: 01/26/2019 Document Reviewed: 10/06/2016 Elsevier Patient Education  2020 [employer].

## 2020-05-14 ENCOUNTER — Ambulatory Visit (INDEPENDENT_AMBULATORY_CARE_PROVIDER_SITE_OTHER): Payer: BC Managed Care – PPO | Admitting: Orthopaedic Surgery

## 2020-05-14 ENCOUNTER — Encounter: Payer: Self-pay | Admitting: Orthopaedic Surgery

## 2020-05-14 VITALS — BP 138/86 | HR 69 | Ht 69.0 in | Wt 200.0 lb

## 2020-05-14 DIAGNOSIS — M79641 Pain in right hand: Secondary | ICD-10-CM

## 2020-05-14 LAB — URIC ACID: Uric Acid, Serum: 6.2 mg/dL (ref 4.0–8.0)

## 2020-05-14 MED ORDER — PREDNISONE 5 MG (21) PO TBPK: ORAL_TABLET | ORAL | 0 refills | Status: DC

## 2020-05-14 NOTE — Progress Notes (Signed)
LFTs have returned to normal. As we discussed, I suspect prior elevation was secondary to alcohol. I recommend he keep up the good work and continue to avoid alcohol, congratulations on his success thus far!

## 2020-05-14 NOTE — Progress Notes (Signed)
Subjective:    Patient ID: Angel Powell, male    DOB: 08/16/1956, 64 y.o.   MRN: [id number]  HPI He has had right hand pain and index finger pain and second metacarpal head pain dorsally since he "jammed" his finger while working about two months ago.  He has seen Dr. Felecia Shelling for this and had x-rays done 04-18-2020.  I have reviewed the notes from Dr. Felecia Shelling.  I have independently reviewed and interpreted x-rays of this patient done at another site by another physician or qualified health professional.  He has gastric reflux and cannot take NSAIDs.  He has swelling of the second metacarpal on the right dominant hand.  He says it is not getting any better.  He has no numbness, no redness.  He has no other joint pain.   Review of Systems  Constitutional: Positive for activity change.  Musculoskeletal: Positive for arthralgias and joint swelling.  All other systems reviewed and are negative.  For Review of Systems, all other systems reviewed and are negative.  The following is a summary of the past history medically, past history surgically, known current medicines, social history and family history.  This information is gathered electronically by the computer from prior information and documentation.  I review this each visit and have found including this information at this point in the chart is beneficial and informative.   Past Medical History:  Diagnosis Date  . GERD (gastroesophageal reflux disease)   . Hypercholesteremia     Past Surgical History:  Procedure Laterality Date  . bone spur     right  . COLONOSCOPY  02/10/2007   Small internal hemorrhoids.  Otherwise normal colon.  Repeat in 10 years.  Marland Kitchen ROTATOR CUFF REPAIR     left, [facility]; [facility]    Current Outpatient Medications on File Prior to Visit  Medication Sig Dispense Refill  . acetaminophen (TYLENOL) 500 MG tablet Take 500-1,000 mg by mouth every 6 (six) hours as needed (back pain.).    Marland Kitchen atorvastatin (LIPITOR) 20 MG  tablet Take 20 mg by mouth daily.    . Menthol, Topical Analgesic, (BIOFREEZE EX) Apply 1 application topically 3 (three) times daily as needed (back pain.).    Marland Kitchen naphazoline-glycerin (CLEAR EYES REDNESS) 0.012-0.2 % SOLN Place 1 drop into both eyes 4 (four) times daily as needed for eye irritation.    . pantoprazole (PROTONIX) 40 MG tablet Take 1 tablet (40 mg total) by mouth daily before breakfast. 30 tablet 5  . [DISCONTINUED] gabapentin (NEURONTIN) 100 MG capsule Take 300 mg by mouth 3 (three) times daily.       No current facility-administered medications on file prior to visit.    Social History   Socioeconomic History  . Marital status: Married    Spouse name: Not on file  . Number of children: Not on file  . Years of education: Not on file  . Highest education level: Not on file  Occupational History  . Not on file  Tobacco Use  . Smoking status: Current Every Day Smoker    Packs/day: 0.50    Years: 0.50    Pack years: 0.25    Types: Cigarettes  . Smokeless tobacco: Never Used  Substance and Sexual Activity  . Alcohol use: Not Currently    Comment: 2-3 beer every other day and a shot of liquor every other day; 05/13/20 "I quit"  . Drug use: No  . Sexual activity: Not on file  Other Topics Concern  .  Not on file  Social History Narrative  . Not on file   Social Determinants of Health   Financial Resource Strain:   . Difficulty of Paying Living Expenses:   Food Insecurity:   . Worried About Programme researcher, broadcasting/film/video in the Last Year:   . Barista in the Last Year:   Transportation Needs:   . Freight forwarder (Medical):   Marland Kitchen Lack of Transportation (Non-Medical):   Physical Activity:   . Days of Exercise per Week:   . Minutes of Exercise per Session:   Stress:   . Feeling of Stress :   Social Connections:   . Frequency of Communication with Friends and Family:   . Frequency of Social Gatherings with Friends and Family:   . Attends Religious Services:     . Active Member of Clubs or Organizations:   . Attends Banker Meetings:   Marland Kitchen Marital Status:   Intimate Partner Violence:   . Fear of Current or Ex-Partner:   . Emotionally Abused:   Marland Kitchen Physically Abused:   . Sexually Abused:     Family History  Problem Relation Age of Onset  . Colon cancer Neg Hx     BP (!) 138/86   Pulse 69   Ht 5\' 9"  (1.753 m)   Wt 200 lb (90.7 kg)   BMI 29.53 kg/m   Body mass index is 29.53 kg/m.     Objective:   Physical Exam Vitals and nursing note reviewed.  Constitutional:      Appearance: He is well-developed.  HENT:     Head: Normocephalic and atraumatic.  Eyes:     Conjunctiva/sclera: Conjunctivae normal.     Pupils: Pupils are equal, round, and reactive to light.  Cardiovascular:     Rate and Rhythm: Normal rate and regular rhythm.  Pulmonary:     Effort: Pulmonary effort is normal.  Abdominal:     Palpations: Abdomen is soft.  Musculoskeletal:       Hands:     Cervical back: Normal range of motion and neck supple.  Skin:    General: Skin is warm and dry.  Neurological:     Mental Status: He is alert and oriented to person, place, and time.     Cranial Nerves: No cranial nerve deficit.     Motor: No abnormal muscle tone.     Coordination: Coordination normal.     Deep Tendon Reflexes: Reflexes are normal and symmetric. Reflexes normal.  Psychiatric:        Behavior: Behavior normal.        Thought Content: Thought content normal.        Judgment: Judgment normal.           Assessment & Plan:   Encounter Diagnosis  Name Primary?  . Pain in right hand Yes   I would like to have his serum acid level checked.  Gout can cause these symptoms.  I will give prednisone dose pack.  I have recommended Aspercreme, Biofreeze or Voltaren gel to the area qid.  Return in one week.  Call if any problem.  Precautions discussed.   Electronically Signed , MD 7/27/20218:24 AM

## 2020-05-17 ENCOUNTER — Encounter: Payer: Self-pay | Admitting: Gastroenterology

## 2020-05-17 NOTE — Assessment & Plan Note (Addendum)
64 year old male who is overdue for colon cancer screening. Last colonoscopy in 2008 with normal colon and small internal hemorrhoids. No significant lower GI symptoms. Denies BRBPR, melena, or unintentional weight loss. No family history of colon cancer.  Plan: Proceed with colonoscopy with propofol with Dr. Marletta Lor in the near future. The risks, benefits, and alternatives have been discussed in detail with patient. They have stated understanding and desire to proceed.  ASA III (history of frequent alcohol use, recently quit in June 2021) Follow-up in 4 months.

## 2020-05-17 NOTE — Assessment & Plan Note (Addendum)
Chronic. Not adequately controlled on omeprazole which she has been on for several years. Continues with breakthrough symptoms daily. No alarm symptoms.  Plan: Stop omeprazole and start Protonix 40 mg daily 30 minutes before first meal of the day. Counseled on GERD diet/lifestyle. Handout provided. Avoid all NSAIDs. Continue to avoid alcohol. Follow-up in 4 months.

## 2020-05-17 NOTE — Assessment & Plan Note (Addendum)
64 year old male with history of HLD, alcohol use, and GERD noted to have elevated LFTs in December 2020 with AST 61, ALT 48, alk phos and total bilirubin within normal limits. Hepatitis C antibody negative, hepatitis B surface antibody and surface antigen nonreactive. Labs completed January 2021 with AST 52, ALT 53. INR 1.0. Ferritin 1304, iron 87, saturation 43%. INR 1.0. Abdominal ultrasound with normal-appearing liver. He has had no signs or symptoms of advanced liver disease. Admitted to drinking 2-3 beer and a shot of liquor every other day but reports he has now quit alcohol altogether. Last drink was in June 2021. Intermittent Tylenol use. No OTC supplements.   Most suspicious that elevated LFTs were secondary to alcohol use. As patient has discontinued alcohol, suspect LFTs have likely improved. We'll plan to update HFP. With elevated ferritin, we'll go ahead and complete hemochromatosis DNA testing. It is possible ferritin may have been elevated due to regular alcohol use. Advised he continue to avoid alcohol. No more than 2000 mg of Tylenol in 24 hours. Follow-up in 4 months.

## 2020-05-20 ENCOUNTER — Other Ambulatory Visit: Payer: Self-pay | Admitting: Emergency Medicine

## 2020-05-20 DIAGNOSIS — R7989 Other specified abnormal findings of blood chemistry: Secondary | ICD-10-CM

## 2020-05-20 LAB — HEPATIC FUNCTION PANEL
AG Ratio: 1.7 (calc) (ref 1.0–2.5)
ALT: 12 U/L (ref 9–46)
AST: 14 U/L (ref 10–35)
Albumin: 4.3 g/dL (ref 3.6–5.1)
Alkaline phosphatase (APISO): 67 U/L (ref 35–144)
Bilirubin, Direct: 0.1 mg/dL (ref 0.0–0.2)
Globulin: 2.5 g/dL (calc) (ref 1.9–3.7)
Indirect Bilirubin: 0.2 mg/dL (calc) (ref 0.2–1.2)
Total Bilirubin: 0.3 mg/dL (ref 0.2–1.2)
Total Protein: 6.8 g/dL (ref 6.1–8.1)

## 2020-05-20 LAB — HEMOCHROMATOSIS DNA-PCR(C282Y,H63D)

## 2020-05-20 NOTE — Progress Notes (Signed)
Please let patient know his test for hemachromatosis was negative. It is possible his ferritin was elevated secondary to alcohol use. As he has discontinued alcohol use, I suspect this will improve as well. I would like to recheck his iron panel with ferritin in 3 months.

## 2020-05-20 NOTE — Addendum Note (Signed)
Addended by: Nigel Berthold on: 05/20/2020 04:51 PM   Modules accepted: Orders

## 2020-05-20 NOTE — Progress Notes (Signed)
fer

## 2020-05-21 ENCOUNTER — Encounter: Payer: Self-pay | Admitting: Orthopaedic Surgery

## 2020-05-21 ENCOUNTER — Ambulatory Visit: Payer: BC Managed Care – PPO | Admitting: Orthopaedic Surgery

## 2020-05-21 ENCOUNTER — Other Ambulatory Visit: Payer: Self-pay

## 2020-05-21 VITALS — BP 126/84 | HR 82 | Ht 69.0 in | Wt 200.0 lb

## 2020-05-21 DIAGNOSIS — M79641 Pain in right hand: Secondary | ICD-10-CM | POA: Diagnosis not present

## 2020-05-21 DIAGNOSIS — F17210 Nicotine dependence, cigarettes, uncomplicated: Secondary | ICD-10-CM

## 2020-05-21 NOTE — Progress Notes (Signed)
Patient XB:MWUXL Angel Powell, male DOB:09/19/1956, 65 y.o. KGM:[id number]  Chief Complaint  Patient presents with  . Hand Pain    right  . Results    lab review     HPI  Angel Powell is a 64 y.o. male who has right hand pain over the second metacarpal head.  He is better.  He has little swelling but still has tenderness.  He took the prednisone.  He cannot take NSAIDs.  He has no redness, no new trauma.  Serum uric acid is 6.2.   Body mass index is 29.53 kg/m.  ROS  Review of Systems  Constitutional: Positive for activity change.  Musculoskeletal: Positive for arthralgias and joint swelling.  All other systems reviewed and are negative.   All other systems reviewed and are negative.  The following is a summary of the past history medically, past history surgically, known current medicines, social history and family history.  This information is gathered electronically by the computer from prior information and documentation.  I review this each visit and have found including this information at this point in the chart is beneficial and informative.    Past Medical History:  Diagnosis Date  . GERD (gastroesophageal reflux disease)   . Hypercholesteremia     Past Surgical History:  Procedure Laterality Date  . bone spur     right  . COLONOSCOPY  02/10/2007   Small internal hemorrhoids.  Otherwise normal colon.  Repeat in 10 years.  Marland Kitchen ROTATOR CUFF REPAIR     left, [facility]; [place]    Family History  Problem Relation Age of Onset  . Colon cancer Neg Hx     Social History Social History   Tobacco Use  . Smoking status: Current Every Day Smoker    Packs/day: 0.50    Years: 0.50    Pack years: 0.25    Types: Cigarettes  . Smokeless tobacco: Never Used  Substance Use Topics  . Alcohol use: Not Currently    Comment: 2-3 beer every other day and a shot of liquor every other day; 05/13/20 "I quit"- last drank in June 2021  . Drug use: No    No Known  Allergies  Current Outpatient Medications  Medication Sig Dispense Refill  . acetaminophen (TYLENOL) 500 MG tablet Take 500-1,000 mg by mouth every 6 (six) hours as needed (back pain.).    Marland Kitchen atorvastatin (LIPITOR) 20 MG tablet Take 20 mg by mouth daily.    . Menthol, Topical Analgesic, (BIOFREEZE EX) Apply 1 application topically 3 (three) times daily as needed (back pain.).    Marland Kitchen naphazoline-glycerin (CLEAR EYES REDNESS) 0.012-0.2 % SOLN Place 1 drop into both eyes 4 (four) times daily as needed for eye irritation.    . pantoprazole (PROTONIX) 40 MG tablet Take 1 tablet (40 mg total) by mouth daily before breakfast. 30 tablet 5   No current facility-administered medications for this visit.     Physical Exam  Blood pressure 126/84, pulse 82, height 5\' 9"  (1.753 m), weight 200 lb (90.7 kg).  Constitutional: overall normal hygiene, normal nutrition, well developed, normal grooming, normal body habitus. Assistive device:none  Musculoskeletal: gait and station Limp none, muscle tone and strength are normal, no tremors or atrophy is present.  .  Neurological: coordination overall normal.  Deep tendon reflex/nerve stretch intact.  Sensation normal.  Cranial nerves II-XII intact.   Skin:   Normal overall no scars, lesions, ulcers or rashes. No psoriasis.  Psychiatric: Alert and oriented x 3.  Recent memory intact, remote memory unclear.  Normal mood and affect. Well groomed.  Good eye contact.  Cardiovascular: overall no swelling, no varicosities, no edema bilaterally, normal temperatures of the legs and arms, no clubbing, cyanosis and good capillary refill.  Lymphatic: palpation is normal.  Right hand has full ROM. He has tenderness over the second metacarpal head and joint but no swelling today and no redness.  NV intact.  Grips normal.  All other systems reviewed and are negative   The patient has been educated about the nature of the problem(s) and counseled on treatment options.   The patient appeared to understand what I have discussed and is in agreement with it.  Encounter Diagnoses  Name Primary?  . Pain in right hand Yes  . Nicotine dependence, cigarettes, uncomplicated     PLAN Call if any problems.  Precautions discussed.  Continue current medications.   Return to clinic 1 month   Electronically Signed Darreld Mclean, MD 8/3/20219:28 AM

## 2020-05-21 NOTE — Patient Instructions (Signed)
Steps to Quit Smoking Smoking tobacco is the leading cause of preventable death. It can affect almost every organ in the body. Smoking puts you and people around you at risk for many serious, long-lasting (chronic) diseases. Quitting smoking can be hard, but it is one of the best things that you can do for your health. It is never too late to quit. How do I get ready to quit? When you decide to quit smoking, make a plan to help you succeed. Before you quit:  Pick a date to quit. Set a date within the next 2 weeks to give you time to prepare.  Write down the reasons why you are quitting. Keep this list in places where you will see it often.  Tell your family, friends, and co-workers that you are quitting. Their support is important.  Talk with your doctor about the choices that may help you quit.  Find out if your health insurance will pay for these treatments.  Know the people, places, things, and activities that make you want to smoke (triggers). Avoid them. What first steps can I take to quit smoking?  Throw away all cigarettes at home, at work, and in your car.  Throw away the things that you use when you smoke, such as ashtrays and lighters.  Clean your car. Make sure to empty the ashtray.  Clean your home, including curtains and carpets. What can I do to help me quit smoking? Talk with your doctor about taking medicines and seeing a counselor at the same time. You are more likely to succeed when you do both.  If you are pregnant or breastfeeding, talk with your doctor about counseling or other ways to quit smoking. Do not take medicine to help you quit smoking unless your doctor tells you to do so. To quit smoking: Quit right away  Quit smoking totally, instead of slowly cutting back on how much you smoke over a period of time.  Go to counseling. You are more likely to quit if you go to counseling sessions regularly. Take medicine You may take medicines to help you quit. Some  medicines need a prescription, and some you can buy over-the-counter. Some medicines may contain a drug called nicotine to replace the nicotine in cigarettes. Medicines may:  Help you to stop having the desire to smoke (cravings).  Help to stop the problems that come when you stop smoking (withdrawal symptoms). Your doctor may ask you to use:  Nicotine patches, gum, or lozenges.  Nicotine inhalers or sprays.  Non-nicotine medicine that is taken by mouth. Find resources Find resources and other ways to help you quit smoking and remain smoke-free after you quit. These resources are most helpful when you use them often. They include:  Online chats with a counselor.  Phone quitlines.  Printed self-help materials.  Support groups or group counseling.  Text messaging programs.  Mobile phone apps. Use apps on your mobile phone or tablet that can help you stick to your quit plan. There are many free apps for mobile phones and tablets as well as websites. Examples include Quit Guide from the CDC and smokefree.gov  What things can I do to make it easier to quit?   Talk to your family and friends. Ask them to support and encourage you.  Call a phone quitline (1-800-QUIT-NOW), reach out to support groups, or work with a counselor.  Ask people who smoke to not smoke around you.  Avoid places that make you want to smoke,   such as: ? Bars. ? Parties. ? Smoke-break areas at work.  Spend time with people who do not smoke.  Lower the stress in your life. Stress can make you want to smoke. Try these things to help your stress: ? Getting regular exercise. ? Doing deep-breathing exercises. ? Doing yoga. ? Meditating. ? Doing a body scan. To do this, close your eyes, focus on one area of your body at a time from head to toe. Notice which parts of your body are tense. Try to relax the muscles in those areas. How will I feel when I quit smoking? Day 1 to 3 weeks Within the first 24 hours,  you may start to have some problems that come from quitting tobacco. These problems are very bad 2-3 days after you quit, but they do not often last for more than 2-3 weeks. You may get these symptoms:  Mood swings.  Feeling restless, nervous, angry, or annoyed.  Trouble concentrating.  Dizziness.  Strong desire for high-sugar foods and nicotine.  Weight gain.  Trouble pooping (constipation).  Feeling like you may vomit (nausea).  Coughing or a sore throat.  Changes in how the medicines that you take for other issues work in your body.  Depression.  Trouble sleeping (insomnia). Week 3 and afterward After the first 2-3 weeks of quitting, you may start to notice more positive results, such as:  Better sense of smell and taste.  Less coughing and sore throat.  Slower heart rate.  Lower blood pressure.  Clearer skin.  Better breathing.  Fewer sick days. Quitting smoking can be hard. Do not give up if you fail the first time. Some people need to try a few times before they succeed. Do your best to stick to your quit plan, and talk with your doctor if you have any questions or concerns. Summary  Smoking tobacco is the leading cause of preventable death. Quitting smoking can be hard, but it is one of the best things that you can do for your health.  When you decide to quit smoking, make a plan to help you succeed.  Quit smoking right away, not slowly over a period of time.  When you start quitting, seek help from your doctor, family, or friends. This information is not intended to replace advice given to you by your health care provider. Make sure you discuss any questions you have with your health care provider. Document Revised: 06/30/2019 Document Reviewed: 12/24/2018 Elsevier Patient Education  2020 [employer]  

## 2020-05-27 ENCOUNTER — Telehealth: Payer: Self-pay | Admitting: *Deleted

## 2020-05-27 NOTE — Telephone Encounter (Signed)
LMTCB needs TCS with propofol Dr. Jena Gauss ASA 2

## 2020-05-28 NOTE — Telephone Encounter (Signed)
Letter mailed

## 2020-06-05 ENCOUNTER — Telehealth: Payer: Self-pay | Admitting: Internal Medicine

## 2020-06-05 NOTE — Telephone Encounter (Signed)
Called pt, TCS w/Propofol w/Dr. Jena Gauss scheduled for 07/25/20 at 7:30am. COVID test 07/24/20 at 8:15am. Orders entered. Appt letter mailed with procedure instructions.

## 2020-06-05 NOTE — Telephone Encounter (Signed)
[phone] patient called to schedule his procedure

## 2020-06-18 ENCOUNTER — Ambulatory Visit: Payer: BC Managed Care – PPO | Admitting: Orthopaedic Surgery

## 2020-06-18 ENCOUNTER — Encounter: Payer: Self-pay | Admitting: Orthopaedic Surgery

## 2020-06-25 ENCOUNTER — Telehealth: Payer: Self-pay | Admitting: *Deleted

## 2020-06-25 NOTE — Telephone Encounter (Signed)
Received VM from patient he is scheduled for procedure tomorrow. He needed to cancel  Called pt and made aware he is not scheduled until 10/7. He asked if I could re mail instruction/covid test appt. Confirmed mailing address.

## 2020-07-22 ENCOUNTER — Telehealth: Payer: Self-pay | Admitting: Internal Medicine

## 2020-07-22 ENCOUNTER — Other Ambulatory Visit: Payer: Self-pay | Admitting: *Deleted

## 2020-07-22 ENCOUNTER — Encounter (HOSPITAL_COMMUNITY): Payer: Self-pay | Admitting: Internal Medicine

## 2020-07-22 ENCOUNTER — Encounter: Payer: Self-pay | Admitting: *Deleted

## 2020-07-22 DIAGNOSIS — R7989 Other specified abnormal findings of blood chemistry: Secondary | ICD-10-CM

## 2020-07-22 NOTE — Telephone Encounter (Signed)
ENDO CALLED AND SAID THAT THE PATIENT STATED HE HAD NOT TRANSPORTATION FOR HIS PROCEDURE AND WAS COMING BY CAB.  HE WAS TOLD HE NEEDS TO HAVE SOMEONE WITH HIM AND MAY NEED TO BE RESCHEDULED.

## 2020-07-22 NOTE — Telephone Encounter (Signed)
PATIENT CALLED BACK AND SAID THE EXPIRATION DATE IS October 2021 ON HIS PREP

## 2020-07-22 NOTE — Telephone Encounter (Signed)
Called pt. He states he is going to see if his wife can go with him. If he can't find someone he will call back to r/s.

## 2020-07-22 NOTE — Telephone Encounter (Signed)
Patient called again about his prep, said that he still has a bottle from 2 years ago when he cancelled his procedure and wants to know if he can take that instead of the mirilax prep!

## 2020-07-22 NOTE — Telephone Encounter (Signed)
Called pt. He reports his prep states it does not expire until 08/18/2020. He wants to use this prep instead. Advised pt will leave his new prep instructions at front desk

## 2020-07-22 NOTE — Telephone Encounter (Signed)
Called pt. He advised me the prep he has is a jug from 2 years ago. He is unable to find expiration date. I advised pt he needs to do the miralax prep as advised

## 2020-07-24 ENCOUNTER — Other Ambulatory Visit (HOSPITAL_COMMUNITY)
Admission: RE | Admit: 2020-07-24 | Discharge: 2020-07-24 | Disposition: A | Discharge status: Home / Self Care | Payer: BC Managed Care – PPO | Source: Ambulatory Visit | Attending: Internal Medicine | Admitting: Internal Medicine

## 2020-07-24 ENCOUNTER — Other Ambulatory Visit: Payer: Self-pay

## 2020-07-24 DIAGNOSIS — Z01812 Encounter for preprocedural laboratory examination: Secondary | ICD-10-CM | POA: Insufficient documentation

## 2020-07-24 DIAGNOSIS — Z20822 Contact with and (suspected) exposure to covid-19: Secondary | ICD-10-CM | POA: Insufficient documentation

## 2020-07-24 DIAGNOSIS — E7800 Pure hypercholesterolemia, unspecified: Secondary | ICD-10-CM | POA: Diagnosis not present

## 2020-07-24 DIAGNOSIS — K5730 Diverticulosis of large intestine without perforation or abscess without bleeding: Secondary | ICD-10-CM | POA: Diagnosis not present

## 2020-07-24 DIAGNOSIS — K641 Second degree hemorrhoids: Secondary | ICD-10-CM | POA: Diagnosis not present

## 2020-07-24 DIAGNOSIS — Z1211 Encounter for screening for malignant neoplasm of colon: Secondary | ICD-10-CM | POA: Diagnosis not present

## 2020-07-24 DIAGNOSIS — F17210 Nicotine dependence, cigarettes, uncomplicated: Secondary | ICD-10-CM | POA: Diagnosis not present

## 2020-07-24 DIAGNOSIS — Z79899 Other long term (current) drug therapy: Secondary | ICD-10-CM | POA: Diagnosis not present

## 2020-07-24 DIAGNOSIS — K219 Gastro-esophageal reflux disease without esophagitis: Secondary | ICD-10-CM | POA: Diagnosis not present

## 2020-07-24 LAB — SARS CORONAVIRUS 2 (TAT 6-24 HRS): SARS Coronavirus 2: NEGATIVE

## 2020-07-25 ENCOUNTER — Encounter (HOSPITAL_COMMUNITY)
Admission: RE | Disposition: A | Discharge status: Home / Self Care | Payer: Self-pay | Source: Home / Self Care | Attending: Internal Medicine

## 2020-07-25 ENCOUNTER — Other Ambulatory Visit: Payer: Self-pay

## 2020-07-25 ENCOUNTER — Ambulatory Visit (HOSPITAL_COMMUNITY): Payer: BC Managed Care – PPO | Admitting: Anesthesiology

## 2020-07-25 ENCOUNTER — Other Ambulatory Visit (HOSPITAL_COMMUNITY)
Admission: RE | Admit: 2020-07-25 | Discharge: 2020-07-25 | Disposition: A | Discharge status: Home / Self Care | Payer: BC Managed Care – PPO | Source: Ambulatory Visit | Attending: Gastroenterology | Admitting: Gastroenterology

## 2020-07-25 ENCOUNTER — Ambulatory Visit (HOSPITAL_COMMUNITY)
Admission: RE | Admit: 2020-07-25 | Discharge: 2020-07-25 | Disposition: A | Discharge status: Home / Self Care | Payer: BC Managed Care – PPO | Attending: Internal Medicine | Admitting: Internal Medicine

## 2020-07-25 ENCOUNTER — Encounter (HOSPITAL_COMMUNITY): Payer: Self-pay | Admitting: Internal Medicine

## 2020-07-25 DIAGNOSIS — E7800 Pure hypercholesterolemia, unspecified: Secondary | ICD-10-CM | POA: Insufficient documentation

## 2020-07-25 DIAGNOSIS — Z1211 Encounter for screening for malignant neoplasm of colon: Secondary | ICD-10-CM | POA: Diagnosis not present

## 2020-07-25 DIAGNOSIS — Z79899 Other long term (current) drug therapy: Secondary | ICD-10-CM | POA: Insufficient documentation

## 2020-07-25 DIAGNOSIS — K641 Second degree hemorrhoids: Secondary | ICD-10-CM | POA: Insufficient documentation

## 2020-07-25 DIAGNOSIS — K5730 Diverticulosis of large intestine without perforation or abscess without bleeding: Secondary | ICD-10-CM | POA: Insufficient documentation

## 2020-07-25 DIAGNOSIS — Z20822 Contact with and (suspected) exposure to covid-19: Secondary | ICD-10-CM | POA: Insufficient documentation

## 2020-07-25 DIAGNOSIS — F17210 Nicotine dependence, cigarettes, uncomplicated: Secondary | ICD-10-CM | POA: Insufficient documentation

## 2020-07-25 DIAGNOSIS — R7989 Other specified abnormal findings of blood chemistry: Secondary | ICD-10-CM | POA: Insufficient documentation

## 2020-07-25 DIAGNOSIS — K219 Gastro-esophageal reflux disease without esophagitis: Secondary | ICD-10-CM | POA: Insufficient documentation

## 2020-07-25 HISTORY — PX: COLONOSCOPY WITH PROPOFOL: SHX5780

## 2020-07-25 LAB — IRON AND TIBC
Iron: 41 ug/dL — ABNORMAL LOW (ref 45–182)
Saturation Ratios: 23 % (ref 17.9–39.5)
TIBC: 182 ug/dL — ABNORMAL LOW (ref 250–450)
UIBC: 141 ug/dL

## 2020-07-25 LAB — FERRITIN: Ferritin: 309 ng/mL (ref 24–336)

## 2020-07-25 SURGERY — COLONOSCOPY WITH PROPOFOL
Anesthesia: General

## 2020-07-25 MED ORDER — LACTATED RINGERS IV SOLN: INTRAVENOUS | Status: DC

## 2020-07-25 MED ORDER — CHLORHEXIDINE GLUCONATE CLOTH 2 % EX PADS: 6.0000 | MEDICATED_PAD | Freq: Once | CUTANEOUS | Status: DC

## 2020-07-25 MED ORDER — PROPOFOL 500 MG/50ML IV EMUL
INTRAVENOUS | Status: DC | PRN
  Administered 2020-07-25: 150 ug/kg/min via INTRAVENOUS
  Administered 2020-07-25: 80 mg via INTRAVENOUS

## 2020-07-25 MED ORDER — LIDOCAINE HCL (CARDIAC) PF 50 MG/5ML IV SOSY
PREFILLED_SYRINGE | INTRAVENOUS | Status: DC | PRN
  Administered 2020-07-25: 60 mg via INTRAVENOUS

## 2020-07-25 NOTE — Transfer of Care (Signed)
Immediate Anesthesia Transfer of Care Note  Patient: Angel Powell  Procedure(s) Performed: COLONOSCOPY WITH PROPOFOL (N/A )  Patient Location: Endoscopy Unit  Anesthesia Type:General  Level of Consciousness: awake, alert , oriented and patient cooperative  Airway & Oxygen Therapy: Patient Spontanous Breathing  Post-op Assessment: Report given to RN, Post -op Vital signs reviewed and stable and Patient moving all extremities  Post vital signs: Reviewed and stable  Last Vitals:  Vitals Value Taken Time  BP    Temp    Pulse    Resp    SpO2      Last Pain:  Vitals:   07/25/20 0816  TempSrc:   PainSc: 3       Patients Stated Pain Goal: 10 (07/25/20 0718)  Complications: No complications documented.

## 2020-07-25 NOTE — Anesthesia Preprocedure Evaluation (Signed)
Anesthesia Evaluation  Patient identified by MRN, date of birth, ID band Patient awake    Reviewed: Allergy & Precautions, H&P , NPO status , Patient's Chart, lab work & pertinent test results, reviewed documented beta blocker date and time   Airway Mallampati: II  TM Distance: >3 FB Neck ROM: full    Dental no notable dental hx. (+) Teeth Intact   Pulmonary neg pulmonary ROS, Current Smoker,    Pulmonary exam normal breath sounds clear to auscultation       Cardiovascular Exercise Tolerance: Good negative cardio ROS   Rhythm:regular Rate:Normal     Neuro/Psych negative neurological ROS  negative psych ROS   GI/Hepatic Neg liver ROS, GERD  Medicated,  Endo/Other  negative endocrine ROS  Renal/GU negative Renal ROS  negative genitourinary   Musculoskeletal   Abdominal   Peds  Hematology negative hematology ROS (+)   Anesthesia Other Findings   Reproductive/Obstetrics negative OB ROS                             Anesthesia Physical Anesthesia Plan  ASA: II  Anesthesia Plan: General   Post-op Pain Management:    Induction:   PONV Risk Score and Plan: Propofol infusion  Airway Management Planned:   Additional Equipment:   Intra-op Plan:   Post-operative Plan:   Informed Consent: I have reviewed the patients History and Physical, chart, labs and discussed the procedure including the risks, benefits and alternatives for the proposed anesthesia with the patient or authorized representative who has indicated his/her understanding and acceptance.     Dental Advisory Given  Plan Discussed with: CRNA  Anesthesia Plan Comments:         Anesthesia Quick Evaluation

## 2020-07-25 NOTE — Anesthesia Postprocedure Evaluation (Signed)
Anesthesia Post Note  Patient: Angel Powell  Procedure(s) Performed: COLONOSCOPY WITH PROPOFOL (N/A )  Patient location during evaluation: Endoscopy Anesthesia Type: General Level of consciousness: awake, oriented, awake and alert and patient cooperative Pain management: pain level controlled Vital Signs Assessment: post-procedure vital signs reviewed and stable Respiratory status: spontaneous breathing, respiratory function stable and nonlabored ventilation Cardiovascular status: blood pressure returned to baseline and stable Postop Assessment: no headache and no backache Anesthetic complications: no   No complications documented.   Last Vitals:  Vitals:   07/25/20 0718  BP: 127/70  Resp: (!) 27  Temp: 37.2 C  SpO2: 97%    Last Pain:  Vitals:   07/25/20 0816  TempSrc:   PainSc: 3                  Brynda Peon

## 2020-07-25 NOTE — Addendum Note (Signed)
Addendum  created 07/25/20 1059 by Brynda Peon, CRNA   Charge Capture section accepted

## 2020-07-25 NOTE — Discharge Instructions (Signed)
Colonoscopy Discharge Instructions  Read the instructions outlined below and refer to this sheet in the next few weeks. These discharge instructions provide you with general information on caring for yourself after you leave the hospital. Your doctor may also give you specific instructions. While your treatment has been planned according to the most current medical practices available, unavoidable complications occasionally occur. If you have any problems or questions after discharge, call Dr. Jena Gauss at [phone]. ACTIVITY  You may resume your regular activity, but move at a slower pace for the next 24 hours.   Take frequent rest periods for the next 24 hours.   Walking will help get rid of the air and reduce the bloated feeling in your belly (abdomen).   No driving for 24 hours (because of the medicine (anesthesia) used during the test).    Do not sign any important legal documents or operate any machinery for 24 hours (because of the anesthesia used during the test).  NUTRITION  Drink plenty of fluids.   You may resume your normal diet as instructed by your doctor.   Begin with a light meal and progress to your normal diet. Heavy or fried foods are harder to digest and may make you feel sick to your stomach (nauseated).   Avoid alcoholic beverages for 24 hours or as instructed.  MEDICATIONS  You may resume your normal medications unless your doctor tells you otherwise.  WHAT YOU CAN EXPECT TODAY  Some feelings of bloating in the abdomen.   Passage of more gas than usual.   Spotting of blood in your stool or on the toilet paper.  IF YOU HAD POLYPS REMOVED DURING THE COLONOSCOPY:  No aspirin products for 7 days or as instructed.   No alcohol for 7 days or as instructed.   Eat a soft diet for the next 24 hours.  FINDING OUT THE RESULTS OF YOUR TEST Not all test results are available during your visit. If your test results are not back during the visit, make an appointment  with your caregiver to find out the results. Do not assume everything is normal if you have not heard from your caregiver or the medical facility. It is important for you to follow up on all of your test results.  SEEK IMMEDIATE MEDICAL ATTENTION IF:  You have more than a spotting of blood in your stool.   Your belly is swollen (abdominal distention).   You are nauseated or vomiting.   You have a temperature over 101.   You have abdominal pain or discomfort that is severe or gets worse throughout the day.    Diverticulosis information provided  Recommend repeat colonoscopy for screening purposes in 10 years  At patient request, I called Martha Clan at [phone] -    PATIENT INSTRUCTIONS POST-ANESTHESIA  IMMEDIATELY FOLLOWING SURGERY:  Do not drive or operate machinery for the first twenty four hours after surgery.  Do not make any important decisions for twenty four hours after surgery or while taking narcotic pain medications or sedatives.  If you develop intractable nausea and vomiting or a severe headache please notify your doctor immediately.  FOLLOW-UP:  Please make an appointment with your surgeon as instructed. You do not need to follow up with anesthesia unless specifically instructed to do so.  WOUND CARE INSTRUCTIONS (if applicable):  Keep a dry clean dressing on the anesthesia/puncture wound site if there is drainage.  Once the wound has quit draining you may leave it open to air.  Generally you should leave the bandage intact for twenty four hours unless there is drainage.  If the epidural site drains for more than 36-48 hours please call the anesthesia department.  QUESTIONS?:  Please feel free to call your physician or the hospital operator if you have any questions, and they will be happy to assist you.       Diverticulosis  Diverticulosis is a condition that develops when small pouches (diverticula) form in the wall of the large intestine (colon). The colon is  where water is absorbed and stool (feces) is formed. The pouches form when the inside layer of the colon pushes through weak spots in the outer layers of the colon. You may have a few pouches or many of them. The pouches usually do not cause problems unless they become inflamed or infected. When this happens, the condition is called diverticulitis. What are the causes? The cause of this condition is not known. What increases the risk? The following factors may make you more likely to develop this condition:  Being older than age 32. Your risk for this condition increases with age. Diverticulosis is rare among people younger than age 41. By age 50, many people have it.  Eating a low-fiber diet.  Having frequent constipation.  Being overweight.  Not getting enough exercise.  Smoking.  Taking over-the-counter pain medicines, like aspirin and ibuprofen.  Having a family history of diverticulosis. What are the signs or symptoms? In most people, there are no symptoms of this condition. If you do have symptoms, they may include:  Bloating.  Cramps in the abdomen.  Constipation or diarrhea.  Pain in the lower left side of the abdomen. How is this diagnosed? Because diverticulosis usually has no symptoms, it is most often diagnosed during an exam for other colon problems. The condition may be diagnosed by:  Using a flexible scope to examine the colon (colonoscopy).  Taking an X-ray of the colon after dye has been put into the colon (barium enema).  Having a CT scan. How is this treated? You may not need treatment for this condition. Your health care provider may recommend treatment to prevent problems. You may need treatment if you have symptoms or if you previously had diverticulitis. Treatment may include:  Eating a high-fiber diet.  Taking a fiber supplement.  Taking a live bacteria supplement (probiotic).  Taking medicine to relax your colon. Follow these instructions at  home: Medicines  Take over-the-counter and prescription medicines only as told by your health care provider.  If told by your health care provider, take a fiber supplement or probiotic. Constipation prevention Your condition may cause constipation. To prevent or treat constipation, you may need to:  Drink enough fluid to keep your urine pale yellow.  Take over-the-counter or prescription medicines.  Eat foods that are high in fiber, such as beans, whole grains, and fresh fruits and vegetables.  Limit foods that are high in fat and processed sugars, such as fried or sweet foods.  General instructions  Try not to strain when you have a bowel movement.  Keep all follow-up visits as told by your health care provider. This is important. Contact a health care provider if you:  Have pain in your abdomen.  Have bloating.  Have cramps.  Have not had a bowel movement in 3 days. Get help right away if:  Your pain gets worse.  Your bloating becomes very bad.  You have a fever or chills, and your symptoms suddenly get worse.  You vomit.  You have bowel movements that are bloody or black.  You have bleeding from your rectum. Summary  Diverticulosis is a condition that develops when small pouches (diverticula) form in the wall of the large intestine (colon).  You may have a few pouches or many of them.  This condition is most often diagnosed during an exam for other colon problems.  Treatment may include increasing the fiber in your diet, taking supplements, or taking medicines. This information is not intended to replace advice given to you by your health care provider. Make sure you discuss any questions you have with your health care provider. Document Revised: 05/04/2019 Document Reviewed: 05/04/2019 Elsevier Patient Education  2020 [employer].

## 2020-07-25 NOTE — Op Note (Signed)
[facility] Patient Name: Angel LewandowskyCecil Audia Procedure Date: 07/25/2020 8:05 AM MRN: [id number] Date of Birth: 03-07-1956 Attending MD: Gennette Pacobert Michael Sabeen Piechocki , MD CSN: [id number] Age: [age] Admit Type: Outpatient Procedure:                Colonoscopy Indications:              Screening for colorectal malignant neoplasm Providers:                Gennette Pacobert Michael Molina Hollenback, MD, Criselda PeachesLurae B. Mathis FareAlbert Charity fundraiserN, RN,                            Judeth CornfieldKristine L. Jessee AversBoone Tech, Technician, Burke Keelsrisann Tilley,                            Pensions consultantTechnician Referring MD:              Medicines:                Propofol per Anesthesia Complications:            No immediate complications. Estimated Blood Loss:     Estimated blood loss was minimal. Procedure:                Pre-Anesthesia Assessment:                           - Prior to the procedure, a History and Physical                            was performed, and patient medications and                            allergies were reviewed. The patient's tolerance of                            previous anesthesia was also reviewed. The risks                            and benefits of the procedure and the sedation                            options and risks were discussed with the patient.                            All questions were answered, and informed consent                            was obtained. Prior Anticoagulants: The patient has                            taken no previous anticoagulant or antiplatelet                            agents. ASA Grade Assessment: II - A patient with  mild systemic disease. After reviewing the risks                            and benefits, the patient was deemed in                            satisfactory condition to undergo the procedure.                           After obtaining informed consent, the colonoscope                            was passed under direct vision. Throughout the                             procedure, the patient's blood pressure, pulse, and                            oxygen saturations were monitored continuously. The                            CF-HQ190L ([id number]) scope was introduced through                            the anus and advanced to the the cecum, identified                            by appendiceal orifice and ileocecal valve. The                            colonoscopy was performed without difficulty. The                            patient tolerated the procedure well. The quality                            of the bowel preparation was adequate. The                            ileocecal valve, appendiceal orifice, and rectum                            were photographed. The entire colon was well                            visualized. Scope In: 8:21:16 AM Scope Out: 8:31:32 AM Scope Withdrawal Time: 0 hours 7 minutes 23 seconds  Total Procedure Duration: 0 hours 10 minutes 16 seconds  Findings:      The perianal and digital rectal examinations were normal.      Scattered medium-mouthed diverticula were found in the entire colon.      Internal hemorrhoids were found during retroflexion. The hemorrhoids       were moderate, medium-sized and Grade II (internal hemorrhoids that       prolapse but reduce  spontaneously).      The exam was otherwise without abnormality on direct and retroflexion       views. normal TI Impression:               - Diverticulosis in the entire examined colon.                           - Internal hemorrhoids.                           - The examination was otherwise normal on direct                            and retroflexion views.                           - No specimens collected. Moderate Sedation:      Moderate (conscious) sedation was personally administered by an       anesthesia professional. The following parameters were monitored: oxygen       saturation, heart rate, blood pressure, respiratory rate, EKG, adequacy       of  pulmonary ventilation, and response to care. Recommendation:           - Patient has a contact number available for                            emergencies. The signs and symptoms of potential                            delayed complications were discussed with the                            patient. Return to normal activities tomorrow.                            Written discharge instructions were provided to the                            patient.                           - Resume previous diet.                           - Continue present medications.                           - Repeat colonoscopy in 10 years for screening                            purposes.                           - Return to GI office (date not yet determined). Procedure Code(s):        --- Professional ---  17510, Colonoscopy, flexible; diagnostic, including                            collection of specimen(s) by brushing or washing,                            when performed (separate procedure) Diagnosis Code(s):        --- Professional ---                           Z12.11, Encounter for screening for malignant                            neoplasm of colon                           K64.1, Second degree hemorrhoids                           K57.30, Diverticulosis of large intestine without                            perforation or abscess without bleeding CPT copyright 2019 American Medical Association. All rights reserved. The codes documented in this report are preliminary and upon coder review may  be revised to meet current compliance requirements. Gerrit Friends. Alfonzia Woolum, MD Gennette Pac, MD 07/25/2020 8:39:24 AM This report has been signed electronically. Number of Addenda: 0

## 2020-07-25 NOTE — H&P (Signed)
@LOGO @   Primary Care Physician:  , MD Primary Gastroenterologist:  Dr. Avon Gully  Pre-Procedure History & Physical: HPI:  Angel Powell is a 64 y.o. male is here for a screening colonoscopy.  No bowel symptoms.  No family history colon cancer.  Negative colonoscopy last performed 2008.  Past Medical History:  Diagnosis Date  . GERD (gastroesophageal reflux disease)   . Hypercholesteremia     Past Surgical History:  Procedure Laterality Date  . bone spur     right  . COLONOSCOPY  02/10/2007   Small internal hemorrhoids.  Otherwise normal colon.  Repeat in 10 years.  02/12/2007 ROTATOR CUFF REPAIR     left, [facility]; [place]    Prior to Admission medications   Medication Sig Start Date End Date Taking? Authorizing Provider  acetaminophen (TYLENOL) 500 MG tablet Take 500-1,000 mg by mouth every 6 (six) hours as needed (back pain.).   Yes [provider]  atorvastatin (LIPITOR) 20 MG tablet Take 20 mg by mouth daily.   Yes [provider]  Biotin w/ Vitamins C & E (HAIR/SKIN/NAILS PO) Take 1 tablet by mouth daily.   Yes [provider]  Menthol, Topical Analgesic, (BIOFREEZE EX) Apply 1 application topically 3 (three) times daily as needed (back pain.).   Yes [provider]  Multiple Vitamins-Minerals (MULTIVITAMIN WITH MINERALS) tablet Take 1 tablet by mouth daily.   Yes [provider]  naphazoline-glycerin (CLEAR EYES REDNESS) 0.012-0.2 % SOLN Place 1 drop into both eyes 4 (four) times daily as needed for eye irritation.   Yes [provider]  omega-3 acid ethyl esters (LOVAZA) 1 g capsule Take 1 g by mouth daily.   Yes [provider]  omeprazole (PRILOSEC) 40 MG capsule Take 40 mg by mouth daily.  07/06/20  Yes [provider]  gabapentin (NEURONTIN) 100 MG capsule Take 300 mg by mouth 3 (three) times daily.    01/02/12  [provider]    Allergies as of 06/05/2020  . (No Known Allergies)     Family History  Problem Relation Age of Onset  . Colon cancer Neg Hx     Social History   Socioeconomic History  . Marital status: Married    Spouse name: Not on file  . Number of children: Not on file  . Years of education: Not on file  . Highest education level: Not on file  Occupational History  . Not on file  Tobacco Use  . Smoking status: Current Every Day Smoker    Packs/day: 1.00    Years: 0.50    Pack years: 0.50    Types: Cigarettes  . Smokeless tobacco: Never Used  Substance and Sexual Activity  . Alcohol use: Not Currently    Comment: 2-3 beer every other day and a shot of liquor every other day; 05/13/20 "I quit"- last drank in June 2021  . Drug use: No  . Sexual activity: Not on file  Other Topics Concern  . Not on file  Social History Narrative  . Not on file   Social Determinants of Health   Financial Resource Strain:   . Difficulty of Paying Living Expenses: Not on file  Food Insecurity:   . Worried About July 2021 in the Last Year: Not on file  . Ran Out of Food in the Last Year: Not on file  Transportation Needs:   . Lack of Transportation (Medical): Not on file  . Lack of Transportation (Non-Medical): Not  on file  Physical Activity:   . Days of Exercise per Week: Not on file  . Minutes of Exercise per Session: Not on file  Stress:   . Feeling of Stress : Not on file  Social Connections:   . Frequency of Communication with Friends and Family: Not on file  . Frequency of Social Gatherings with Friends and Family: Not on file  . Attends Religious Services: Not on file  . Active Member of Clubs or Organizations: Not on file  . Attends Banker Meetings: Not on file  . Marital Status: Not on file  Intimate Partner Violence:   . Fear of Current or Ex-Partner: Not on file  . Emotionally Abused: Not on file  . Physically Abused: Not on file  . Sexually Abused: Not on file    Review of Systems: See HPI, otherwise  negative ROS  Physical Exam: BP 127/70   Temp 98.9 F (37.2 C) (Oral)   Resp (!) 27   Ht 5\' 9"  (1.753 m)   Wt 95.3 kg   SpO2 97%   BMI 31.01 kg/m  General:   Alert,  Well-developed, well-nourished, pleasant and cooperative in NAD Lungs:  Clear throughout to auscultation.   No wheezes, crackles, or rhonchi. No acute distress. Heart:  Regular rate and rhythm; no murmurs, clicks, rubs,  or gallops. Abdomen:  Soft, nontender and nondistended. No masses, hepatosplenomegaly or hernias noted. Normal bowel sounds, without guarding, and without rebound.   Impression/Plan: Angel Powell is now here to undergo a screening colonoscopy.  Average risk screening examination.  Risks, benefits, limitations, imponderables and alternatives regarding colonoscopy have been reviewed with the patient. Questions have been answered. All parties agreeable.     Notice:  This dictation was prepared with Dragon dictation along with smaller phrase technology. Any transcriptional errors that result from this process are unintentional and may not be corrected upon review.

## 2020-07-31 ENCOUNTER — Encounter (HOSPITAL_COMMUNITY): Payer: Self-pay | Admitting: Internal Medicine

## 2020-08-13 ENCOUNTER — Ambulatory Visit: Payer: BC Managed Care – PPO | Admitting: Orthopaedic Surgery

## 2020-09-14 NOTE — Progress Notes (Deleted)
Referring Provider: Avon Gully, MD Primary Care Physician:  Avon Gully, MD Primary GI Physician: Dr. Marletta Lor  No chief complaint on file.   HPI:   Angel Powell is a 64 y.o. male presenting today with a history of GERD, elevated LFTs, thrombocytopenia, alcohol abuse but quit in June 2021, and internal hemorrhoids. Colonoscopy up to date in October 2021, due for repeat in 2031. Prior evaluation of elevated LFTs has included Hep A, Hep B, and Hep C all negative without immunity to Hep A or B, Ferritin 1304 which later normalized in October following alcohol cessation, hemochromatosis DNA  testing negative for C282Y and H63D variants. US abdomen with liver wnl, cyst on spleen which appears benign. LFTs normalized in July following alcohol cessation.  Iron slightly low at 41 in October 2021.   Today:   GERD:   Elevated LFTs:   Recheck iron panel in January. Recheck LFTs one more time.   Past Medical History:  Diagnosis Date  . GERD (gastroesophageal reflux disease)   . Hypercholesteremia     Past Surgical History:  Procedure Laterality Date  . bone spur     right  . COLONOSCOPY  02/10/2007   Small internal hemorrhoids.  Otherwise normal colon.  Repeat in 10 years.  . COLONOSCOPY WITH PROPOFOL N/A 07/25/2020   Procedure: COLONOSCOPY WITH PROPOFOL;  Surgeon: Corbin Ade, MD;  [facility]: [facility];  Service: Endoscopy;  Laterality: N/A;  7:30am  . ROTATOR CUFF REPAIR     left, [facility]; [place]    Current Outpatient Medications  Medication Sig Dispense Refill  . acetaminophen (TYLENOL) 500 MG tablet Take 500-1,000 mg by mouth every 6 (six) hours as needed (back pain.).    Marland Kitchen atorvastatin (LIPITOR) 20 MG tablet Take 20 mg by mouth daily.    . Biotin w/ Vitamins C & E (HAIR/SKIN/NAILS PO) Take 1 tablet by mouth daily.    . Menthol, Topical Analgesic, (BIOFREEZE EX) Apply 1 application topically 3 (three) times daily as needed (back pain.).    Marland Kitchen Multiple  Vitamins-Minerals (MULTIVITAMIN WITH MINERALS) tablet Take 1 tablet by mouth daily.    . naphazoline-glycerin (CLEAR EYES REDNESS) 0.012-0.2 % SOLN Place 1 drop into both eyes 4 (four) times daily as needed for eye irritation.    Marland Kitchen omega-3 acid ethyl esters (LOVAZA) 1 g capsule Take 1 g by mouth daily.    Marland Kitchen omeprazole (PRILOSEC) 40 MG capsule Take 40 mg by mouth daily.      No current facility-administered medications for this visit.    Allergies as of 09/16/2020  . (No Known Allergies)    Family History  Problem Relation Age of Onset  . Colon cancer Neg Hx     Social History   Socioeconomic History  . Marital status: Married    Spouse name: Not on file  . Number of children: Not on file  . Years of education: Not on file  . Highest education level: Not on file  Occupational History  . Not on file  Tobacco Use  . Smoking status: Current Every Day Smoker    Packs/day: 1.00    Years: 0.50    Pack years: 0.50    Types: Cigarettes  . Smokeless tobacco: Never Used  Substance and Sexual Activity  . Alcohol use: Not Currently    Comment: 2-3 beer every other day and a shot of liquor every other day; 05/13/20 "I quit"- last drank in June 2021  . Drug use: No  .  Sexual activity: Not on file  Other Topics Concern  . Not on file  Social History Narrative  . Not on file   Social Determinants of Health   Financial Resource Strain:   . Difficulty of Paying Living Expenses: Not on file  Food Insecurity:   . Worried About Programme researcher, broadcasting/film/video in the Last Year: Not on file  . Ran Out of Food in the Last Year: Not on file  Transportation Needs:   . Lack of Transportation (Medical): Not on file  . Lack of Transportation (Non-Medical): Not on file  Physical Activity:   . Days of Exercise per Week: Not on file  . Minutes of Exercise per Session: Not on file  Stress:   . Feeling of Stress : Not on file  Social Connections:   . Frequency of Communication with Friends and Family:  Not on file  . Frequency of Social Gatherings with Friends and Family: Not on file  . Attends Religious Services: Not on file  . Active Member of Clubs or Organizations: Not on file  . Attends Banker Meetings: Not on file  . Marital Status: Not on file    Review of Systems: Gen: Denies fever, chills, anorexia. Denies fatigue, weakness, weight loss.  CV: Denies chest pain, palpitations, syncope, peripheral edema, and claudication. Resp: Denies dyspnea at rest, cough, wheezing, coughing up blood, and pleurisy. GI: Denies vomiting blood, jaundice, and fecal incontinence.   Denies dysphagia or odynophagia. Derm: Denies rash, itching, dry skin Psych: Denies depression, anxiety, memory loss, confusion. No homicidal or suicidal ideation.  Heme: Denies bruising, bleeding, and enlarged lymph nodes.  Physical Exam: There were no vitals taken for this visit. General:   Alert and oriented. No distress noted. Pleasant and cooperative.  Head:  Normocephalic and atraumatic. Eyes:  Conjuctiva clear without scleral icterus. Mouth:  Oral mucosa pink and moist. Good dentition. No lesions. Heart:  S1, S2 present without murmurs appreciated. Lungs:  Clear to auscultation bilaterally. No wheezes, rales, or rhonchi. No distress.  Abdomen:  +BS, soft, non-tender and non-distended. No rebound or guarding. No HSM or masses noted. Msk:  Symmetrical without gross deformities. Normal posture. Extremities:  Without edema. Neurologic:  Alert and  oriented x4 Psych:  Alert and cooperative. Normal mood and affect.

## 2020-09-16 ENCOUNTER — Telehealth: Payer: Self-pay | Admitting: Gastroenterology

## 2020-09-16 ENCOUNTER — Ambulatory Visit: Payer: BC Managed Care – PPO | Admitting: Gastroenterology

## 2020-09-16 NOTE — Telephone Encounter (Signed)
Reviewed

## 2020-09-16 NOTE — Telephone Encounter (Signed)
Patient no show x 3 °

## 2020-10-30 ENCOUNTER — Ambulatory Visit (HOSPITAL_COMMUNITY)
Admission: RE | Admit: 2020-10-30 | Discharge: 2020-10-30 | Disposition: A | Discharge status: Home / Self Care | Payer: BC Managed Care – PPO | Source: Ambulatory Visit | Attending: Gerontology | Admitting: Gerontology

## 2020-10-30 ENCOUNTER — Other Ambulatory Visit: Payer: Self-pay | Admitting: Gerontology

## 2020-10-30 ENCOUNTER — Other Ambulatory Visit (HOSPITAL_COMMUNITY): Payer: Self-pay | Admitting: Gerontology

## 2020-10-30 ENCOUNTER — Other Ambulatory Visit: Payer: Self-pay

## 2020-10-30 DIAGNOSIS — I739 Peripheral vascular disease, unspecified: Secondary | ICD-10-CM

## 2020-10-30 DIAGNOSIS — M25511 Pain in right shoulder: Secondary | ICD-10-CM

## 2020-11-05 ENCOUNTER — Ambulatory Visit (HOSPITAL_COMMUNITY)
Admission: RE | Admit: 2020-11-05 | Discharge: 2020-11-05 | Disposition: A | Discharge status: Home / Self Care | Payer: BC Managed Care – PPO | Source: Ambulatory Visit | Attending: Gerontology | Admitting: Gerontology

## 2020-11-05 ENCOUNTER — Other Ambulatory Visit: Payer: Self-pay

## 2020-11-05 DIAGNOSIS — I739 Peripheral vascular disease, unspecified: Secondary | ICD-10-CM | POA: Insufficient documentation

## 2020-11-28 ENCOUNTER — Ambulatory Visit (INDEPENDENT_AMBULATORY_CARE_PROVIDER_SITE_OTHER): Payer: BC Managed Care – PPO | Admitting: Orthopedic Surgery

## 2020-11-28 ENCOUNTER — Encounter: Payer: Self-pay | Admitting: Orthopedic Surgery

## 2020-11-28 ENCOUNTER — Other Ambulatory Visit: Payer: Self-pay

## 2020-11-28 VITALS — BP 147/89 | HR 92 | Ht 67.5 in | Wt 233.0 lb

## 2020-11-28 DIAGNOSIS — M25511 Pain in right shoulder: Secondary | ICD-10-CM | POA: Diagnosis not present

## 2020-11-28 DIAGNOSIS — G8929 Other chronic pain: Secondary | ICD-10-CM

## 2020-11-28 NOTE — Progress Notes (Signed)
NEW PROBLEM//OFFICE VISIT  Summary assessment and plan:  65 year old male will be sent for MRI to evaluate for possible recurrent rotator cuff tear.  Patient had distal clavicle excision open rotator cuff repair after arthroscopy right shoulder about 15 years ago and now has pain weakness and cannot abduct his arm  Chief Complaint  Patient presents with  . Shoulder Pain    Right thinks its due to type of work he was doing    65 year old male who was at work doing a job which required him to move a lot of boxes last year over that time he developed pain in weakness in his right shoulder which is progressed now to inability to pick up anything with his arm away from his body.  His pain is worsening   Review of Systems  Respiratory: Negative for shortness of breath.   Cardiovascular: Negative for chest pain.  Neurological: Positive for focal weakness. Negative for tingling.     Past Medical History:  Diagnosis Date  . GERD (gastroesophageal reflux disease)   . Hypercholesteremia     Past Surgical History:  Procedure Laterality Date  . bone spur     right  . COLONOSCOPY  02/10/2007   Small internal hemorrhoids.  Otherwise normal colon.  Repeat in 10 years.  . COLONOSCOPY WITH PROPOFOL N/A 07/25/2020   Procedure: COLONOSCOPY WITH PROPOFOL;  Surgeon: Corbin Ade, MD;  Location: [facility];  Service: Endoscopy;  Laterality: N/A;  7:30am  . ROTATOR CUFF REPAIR     left, [facility]; [place]    Family History  Problem Relation Age of Onset  . Colon cancer Neg Hx    Social History   Tobacco Use  . Smoking status: Current Every Day Smoker    Packs/day: 1.00    Years: 0.50    Pack years: 0.50    Types: Cigarettes  . Smokeless tobacco: Never Used  Substance Use Topics  . Alcohol use: Not Currently    Comment: 2-3 beer every other day and a shot of liquor every other day; 05/13/20 "I quit"- last drank in June 2021  . Drug use: No    No Known Allergies  Current Meds   Medication Sig  . acetaminophen (TYLENOL) 500 MG tablet Take 500-1,000 mg by mouth every 6 (six) hours as needed (back pain.).  Marland Kitchen atorvastatin (LIPITOR) 20 MG tablet Take 20 mg by mouth daily.  . Biotin w/ Vitamins C & E (HAIR/SKIN/NAILS PO) Take 1 tablet by mouth daily.  . Menthol, Topical Analgesic, (BIOFREEZE EX) Apply 1 application topically 3 (three) times daily as needed (back pain.).  Marland Kitchen Multiple Vitamins-Minerals (MULTIVITAMIN WITH MINERALS) tablet Take 1 tablet by mouth daily.  . naphazoline-glycerin (CLEAR EYES REDNESS) 0.012-0.2 % SOLN Place 1 drop into both eyes 4 (four) times daily as needed for eye irritation.  Marland Kitchen omega-3 acid ethyl esters (LOVAZA) 1 g capsule Take 1 g by mouth daily.  Marland Kitchen omeprazole (PRILOSEC) 40 MG capsule Take 40 mg by mouth daily.     BP (!) 147/89   Pulse 92   Ht 5' 7.5" (1.715 m)   Wt 233 lb (105.7 kg)   BMI 35.95 kg/m   Physical Exam Constitutional:      General: He is not in acute distress.    Appearance: He is well-developed.     Comments: Well developed, well nourished Normal grooming and hygiene     Cardiovascular:     Comments: No peripheral edema Musculoskeletal:     Comments:  Right shoulder has 2 incisions one from the distal clavicle excision and one from a cuff repair  He can abduct the shoulder to 90 degrees but has no strength in direct abduction he can flex up to 150 degrees but has no strength with the empty can sign.  He has a negative drop arm test negative external rotation lag test.  Cuff strength supraspinatus 4 out of 5 internal rotation 5 out of 5 external rotation 4+ out of 5  Skin:    General: Skin is warm and dry.  Neurological:     Mental Status: He is alert and oriented to person, place, and time.     Sensory: No sensory deficit.     Coordination: Coordination normal.     Gait: Gait normal.     Deep Tendon Reflexes: Reflexes are normal and symmetric.  Psychiatric:        Mood and Affect: Mood normal.         Behavior: Behavior normal.        Thought Content: Thought content normal.        Judgment: Judgment normal.     Comments: Affect normal          MEDICAL DECISION MAKING  A.  Encounter Diagnosis  Name Primary?  . Chronic right shoulder pain Yes    B. DATA ANALYSED:   IMAGING: Interpretation of images: Sternal External images show 2 suture anchors in the right shoulder high riding humeral head normal glenohumeral joint suggestive of cuff tear Orders: Order MRI  Outside records reviewed: no   C. MANAGEMENT   MRI  No orders of the defined types were placed in this encounter.     Fuller Canada, MD  11/28/2020 2:18 PM

## 2020-12-11 ENCOUNTER — Encounter (HOSPITAL_COMMUNITY): Payer: Self-pay

## 2020-12-11 ENCOUNTER — Ambulatory Visit (HOSPITAL_COMMUNITY)
Admission: RE | Admit: 2020-12-11 | Discharge: 2020-12-11 | Disposition: A | Discharge status: Home / Self Care | Payer: BC Managed Care – PPO | Source: Ambulatory Visit | Attending: Orthopedic Surgery | Admitting: Orthopedic Surgery

## 2020-12-11 DIAGNOSIS — M25511 Pain in right shoulder: Secondary | ICD-10-CM | POA: Insufficient documentation

## 2020-12-11 DIAGNOSIS — G8929 Other chronic pain: Secondary | ICD-10-CM | POA: Insufficient documentation

## 2020-12-11 MED ORDER — LIDOCAINE HCL (PF) 1 % IJ SOLN
INTRAMUSCULAR | Status: AC
  Filled 2020-12-11: qty 5

## 2020-12-11 MED ORDER — POVIDONE-IODINE 10 % EX SOLN
CUTANEOUS | Status: AC
  Filled 2020-12-11: qty 15

## 2020-12-11 MED ORDER — LIDOCAINE HCL (PF) 1 % IJ SOLN
5.0000 mL | Freq: Once | INTRAMUSCULAR | Status: AC
  Administered 2020-12-11: 5 mL

## 2020-12-11 MED ORDER — POVIDONE-IODINE 10 % EX SOLN
CUTANEOUS | Status: DC | PRN
  Administered 2020-12-11: 1 via TOPICAL

## 2020-12-11 MED ORDER — GADOBUTROL 1 MMOL/ML IV SOLN
2.0000 mL | Freq: Once | INTRAVENOUS | Status: AC | PRN
  Administered 2020-12-11: 0.05 mL

## 2020-12-11 MED ORDER — SODIUM CHLORIDE (PF) 0.9 % IJ SOLN
INTRAMUSCULAR | Status: AC
  Administered 2020-12-11: 5 mL
  Filled 2020-12-11: qty 10

## 2020-12-11 MED ORDER — IOHEXOL 180 MG/ML  SOLN
20.0000 mL | Freq: Once | INTRAMUSCULAR | Status: AC | PRN
  Administered 2020-12-11: 15 mL via INTRA_ARTICULAR

## 2020-12-11 NOTE — Procedures (Signed)
Preprocedure Dx: Recurrent right shoulder pain, prior rotator cuff repair Postprocedure Dx: Recurrent right shoulder pain, prior rotator cuff repair Procedure  Fluoroscopically guided right joint injection for MR arthrogrpahy Radiologist:  Tyron Russell Anesthesia:  3 ml of 1% lidocaine Injectate:  10 ml of standard MR arthrography solution Fluoro time:  1 minutes 0 seconds EBL:   None Complications: None

## 2020-12-12 ENCOUNTER — Other Ambulatory Visit: Payer: Self-pay

## 2020-12-12 ENCOUNTER — Encounter: Payer: Self-pay | Admitting: Orthopedic Surgery

## 2020-12-12 ENCOUNTER — Ambulatory Visit (INDEPENDENT_AMBULATORY_CARE_PROVIDER_SITE_OTHER): Payer: BC Managed Care – PPO | Admitting: Orthopedic Surgery

## 2020-12-12 VITALS — Ht 67.5 in | Wt 233.0 lb

## 2020-12-12 DIAGNOSIS — S46011A Strain of muscle(s) and tendon(s) of the rotator cuff of right shoulder, initial encounter: Secondary | ICD-10-CM

## 2020-12-12 DIAGNOSIS — G8929 Other chronic pain: Secondary | ICD-10-CM | POA: Diagnosis not present

## 2020-12-12 DIAGNOSIS — S46011D Strain of muscle(s) and tendon(s) of the rotator cuff of right shoulder, subsequent encounter: Secondary | ICD-10-CM

## 2020-12-12 MED ORDER — INDOMETHACIN 25 MG PO CAPS: 25.0000 mg | ORAL_CAPSULE | Freq: Three times a day (TID) | ORAL | 2 refills | Status: DC

## 2020-12-12 NOTE — Addendum Note (Signed)
Addended byCaffie Damme on: 12/12/2020 01:14 PM   Modules accepted: Orders

## 2020-12-12 NOTE — Progress Notes (Signed)
MRI RESULTS FOLLOW UP   Encounter Diagnoses  Name Primary?  . Chronic right shoulder pain Yes  . Traumatic complete tear of right rotator cuff, subsequent encounter     Chief Complaint  Patient presents with  . Shoulder Pain    Rt shoulder MRI results    Angel Powell comes in after his MRI has been completed with contrast  Prior history  65 year old male who was at work doing a job which required him to move a lot of boxes last year over that time he developed pain in weakness in his right shoulder which is progressed now to inability to pick up anything with his arm away from his body.  His pain is worsening  Still complaining of pain.  Some giving way in certain positions but he has not lost his ability to elevate his arm over his head   + EXAM FINDINGS: Pain in the empty can position normal abduction and flexion MY READING OF THE MRI torn supraspinatus partial tear infraspinatus tendinitis biceps arthritis glenohumeral joint  MRI REPORT:    IMPRESSION: 1. Evidence of prior rotator cuff repair. Full-thickness, partial width tear of the anterior supraspinatus tendon. Remaining intact tendon is severely thinned. 2. High-grade partial-thickness bursal surface tear of the distal subscapularis tendon at the insertion with proximal intrasubstance extension. 3. Moderate distal infraspinatus tendinosis with small low-grade partial-thickness articular surface tear at the insertion. 4. Moderate biceps tendinosis with partial tear. 5. Mild glenohumeral osteoarthritis.     Electronically Signed   By: Obie Dredge M.D.   On: 12/12/2020 08:57   ASSESSMENT AND PLAN :  65 year old male previous rotator cuff new injury, pain primary complaint.  MRI positive for retear with some glenohumeral arthritis some fatty infiltration no atrophy  Recommend physical therapy and anti-inflammatory medication  X4 weeks.  If no improvement patient understands that cuff repair with augmentation  would be needed to repair his tendon   Encounter Diagnoses  Name Primary?  . Chronic right shoulder pain Yes  . Traumatic complete tear of right rotator cuff, subsequent encounter     Meds ordered this encounter  Medications  . indomethacin (INDOCIN) 25 MG capsule    Sig: Take 1 capsule (25 mg total) by mouth 3 (three) times daily with meals.    Dispense:  60 capsule    Refill:  2

## 2020-12-12 NOTE — Patient Instructions (Addendum)
Start therapy   Start indocoin 3 x a day   Smoking cessation counseling   Steps to Quit Smoking Smoking tobacco is the leading cause of preventable death. It can affect almost every organ in the body. Smoking puts you and those around you at risk for developing many serious chronic diseases. Quitting smoking can be difficult, but it is one of the best things that you can do for your health. It is never too late to quit. How do I get ready to quit? When you decide to quit smoking, create a plan to help you succeed. Before you quit:  Pick a date to quit. Set a date within the next 2 weeks to give you time to prepare.  Write down the reasons why you are quitting. Keep this list in places where you will see it often.  Tell your family, friends, and co-workers that you are quitting. Support from your loved ones can make quitting easier.  Talk with your health care provider about your options for quitting smoking.  Find out what treatment options are covered by your health insurance.  Identify people, places, things, and activities that make you want to smoke (triggers). Avoid them. What first steps can I take to quit smoking?  Throw away all cigarettes at home, at work, and in your car.  Throw away smoking accessories, such as Set designer.  Clean your car. Make sure to empty the ashtray.  Clean your home, including curtains and carpets. What strategies can I use to quit smoking? Talk with your health care provider about combining strategies, such as taking medicines while you are also receiving in-person counseling. Using these two strategies together makes you more likely to succeed in quitting than if you used either strategy on its own.  If you are pregnant or breastfeeding, talk with your health care provider about finding counseling or other support strategies to quit smoking. Do not take medicine to help you quit smoking unless your health care provider tells you to do  so. To quit smoking: Quit right away  Quit smoking completely, instead of gradually reducing how much you smoke over a period of time. Research shows that stopping smoking right away is more successful than gradually quitting.  Attend in-person counseling to help you build problem-solving skills. You are more likely to succeed in quitting if you attend counseling sessions regularly. Even short sessions of 10 minutes can be effective. Take medicine You may take medicines to help you quit smoking. Some medicines require a prescription and some you can purchase over-the-counter. Medicines may have nicotine in them to replace the nicotine in cigarettes. Medicines may:  Help to stop cravings.  Help to relieve withdrawal symptoms. Your health care provider may recommend:  Nicotine patches, gum, or lozenges.  Nicotine inhalers or sprays.  Non-nicotine medicine that is taken by mouth. Find resources Find resources and support systems that can help you to quit smoking and remain smoke-free after you quit. These resources are most helpful when you use them often. They include:  Online chats with a Veterinary surgeon.  Telephone quitlines.  Printed Materials engineer.  Support groups or group counseling.  Text messaging programs.  Mobile phone apps or applications. Use apps that can help you stick to your quit plan by providing reminders, tips, and encouragement. There are many free apps for mobile devices as well as websites. Examples include Quit Guide from the [employer] and smokefree.gov   What things can I do to make it easier to  quit?  Reach out to your family and friends for support and encouragement. Call telephone quitlines (1-800-QUIT-NOW), reach out to support groups, or work with a counselor for support.  Ask people who smoke to avoid smoking around you.  Avoid places that trigger you to smoke, such as bars, parties, or smoke-break areas at work.  Spend time with people who do not  smoke.  Lessen the stress in your life. Stress can be a smoking trigger for some people. To lessen stress, try: ? Exercising regularly. ? Doing deep-breathing exercises. ? Doing yoga. ? Meditating. ? Performing a body scan. This involves closing your eyes, scanning your body from head to toe, and noticing which parts of your body are particularly tense. Try to relax the muscles in those areas.   How will I feel when I quit smoking? Day 1 to 3 weeks Within the first 24 hours of quitting smoking, you may start to feel withdrawal symptoms. These symptoms are usually most noticeable 2-3 days after quitting, but they usually do not last for more than 2-3 weeks. You may experience these symptoms:  Mood swings.  Restlessness, anxiety, or irritability.  Trouble concentrating.  Dizziness.  Strong cravings for sugary foods and nicotine.  Mild weight gain.  Constipation.  Nausea.  Coughing or a sore throat.  Changes in how the medicines that you take for unrelated issues work in your body.  Depression.  Trouble sleeping (insomnia). Week 3 and afterward After the first 2-3 weeks of quitting, you may start to notice more positive results, such as:  Improved sense of smell and taste.  Decreased coughing and sore throat.  Slower heart rate.  Lower blood pressure.  Clearer skin.  The ability to breathe more easily.  Fewer sick days. Quitting smoking can be very challenging. Do not get discouraged if you are not successful the first time. Some people need to make many attempts to quit before they achieve long-term success. Do your best to stick to your quit plan, and talk with your health care provider if you have any questions or concerns. Summary  Smoking tobacco is the leading cause of preventable death. Quitting smoking is one of the best things that you can do for your health.  When you decide to quit smoking, create a plan to help you succeed.  Quit smoking right away,  not slowly over a period of time.  When you start quitting, seek help from your health care provider, family, or friends. This information is not intended to replace advice given to you by your health care provider. Make sure you discuss any questions you have with your health care provider. Document Revised: 06/30/2019 Document Reviewed: 12/24/2018 Elsevier Patient Education  2021 [employer].

## 2020-12-16 ENCOUNTER — Telehealth: Payer: Self-pay | Admitting: Orthopedic Surgery

## 2020-12-16 NOTE — Telephone Encounter (Signed)
Patient said he was told at PT that his insurance will not cover for him to have PT.  He wants to know what his next step is.  Please advise him  Thanks

## 2020-12-16 NOTE — Telephone Encounter (Signed)
He states he can not even afford a single visit for therapy, I told him to come by tomorrow and pick up exercises and I will show him what to do  He also asks for note light duty no lifting  I told him to pick up tomorrow   To you FYI

## 2020-12-18 ENCOUNTER — Ambulatory Visit (HOSPITAL_COMMUNITY): Payer: BC Managed Care – PPO | Admitting: Occupational Therapy

## 2021-01-16 ENCOUNTER — Ambulatory Visit: Payer: BC Managed Care – PPO | Admitting: Orthopedic Surgery

## 2021-01-16 ENCOUNTER — Other Ambulatory Visit: Payer: Self-pay

## 2021-01-16 ENCOUNTER — Telehealth: Payer: Self-pay | Admitting: Orthopedic Surgery

## 2021-01-16 VITALS — BP 137/87 | HR 85 | Ht 69.0 in | Wt 230.0 lb

## 2021-01-16 DIAGNOSIS — S46011D Strain of muscle(s) and tendon(s) of the rotator cuff of right shoulder, subsequent encounter: Secondary | ICD-10-CM | POA: Diagnosis not present

## 2021-01-16 NOTE — Progress Notes (Signed)
Chief Complaint  Patient presents with  . Shoulder Pain    Rt shoulder pain   65 year old male large rotator cuff tear comes in for follow-up.  He says these got good pain relief with Indocin  His MRI showed a large rotator cuff tear with 3 cm of retraction  He says that if he does have surgery he would like to try repair with superior capsular reconstruction  His forward elevation is good but he has pain and weakness especially in abduction  He will let me know if he changes his mind but does not have surgery right now  Continue Indocin Encounter Diagnosis  Name Primary?  . Traumatic complete tear of right rotator cuff, subsequent encounter Yes

## 2021-01-16 NOTE — Telephone Encounter (Signed)
Angel Powell was seen this morning but called back requesting a stronger medication but that wont make him drowsy.  He states he uses [facility] in [place]

## 2021-01-20 ENCOUNTER — Encounter: Payer: Self-pay | Admitting: Orthopedic Surgery

## 2021-01-20 ENCOUNTER — Telehealth: Payer: Self-pay | Admitting: Orthopedic Surgery

## 2021-01-20 NOTE — Telephone Encounter (Signed)
Patient called and want's his work note changed to go back to full duty now.   Please call him back .

## 2021-01-20 NOTE — Telephone Encounter (Signed)
Need more information. What makes him drowsy? I will call him.

## 2021-01-20 NOTE — Telephone Encounter (Signed)
Ok to provide note return to work full duty/ per Dr Romeo Apple

## 2021-01-20 NOTE — Telephone Encounter (Signed)
He is taking indomethacin states he was asking for something else but now it seems to be helping he is feeling better.

## 2021-02-21 ENCOUNTER — Other Ambulatory Visit: Payer: Self-pay | Admitting: Orthopedic Surgery

## 2021-02-21 DIAGNOSIS — G8929 Other chronic pain: Secondary | ICD-10-CM

## 2021-02-21 DIAGNOSIS — S46011D Strain of muscle(s) and tendon(s) of the rotator cuff of right shoulder, subsequent encounter: Secondary | ICD-10-CM

## 2021-02-21 DIAGNOSIS — M25511 Pain in right shoulder: Secondary | ICD-10-CM

## 2021-03-21 ENCOUNTER — Other Ambulatory Visit (HOSPITAL_COMMUNITY)
Admission: RE | Admit: 2021-03-21 | Discharge: 2021-03-21 | Disposition: A | Discharge status: Home / Self Care | Payer: BC Managed Care – PPO | Source: Ambulatory Visit | Attending: Internal Medicine | Admitting: Internal Medicine

## 2021-03-21 ENCOUNTER — Other Ambulatory Visit: Payer: Self-pay

## 2021-03-21 DIAGNOSIS — E559 Vitamin D deficiency, unspecified: Secondary | ICD-10-CM | POA: Insufficient documentation

## 2021-03-21 LAB — BASIC METABOLIC PANEL
Anion gap: 8 (ref 5–15)
BUN: 16 mg/dL (ref 8–23)
CO2: 26 mmol/L (ref 22–32)
Calcium: 9.5 mg/dL (ref 8.9–10.3)
Chloride: 102 mmol/L (ref 98–111)
Creatinine, Ser: 0.9 mg/dL (ref 0.61–1.24)
GFR, Estimated: >60 mL/min (ref >60)
Glucose, Bld: 142 mg/dL — ABNORMAL HIGH (ref 70–99)
Potassium: 4.1 mmol/L (ref 3.5–5.1)
Sodium: 136 mmol/L (ref 135–145)

## 2021-03-22 LAB — CALCITRIOL (1,25 DI-OH VIT D): Vit D, 1,25-Dihydroxy: 77.6 pg/mL (ref 19.9–79.3)

## 2021-06-30 ENCOUNTER — Encounter: Payer: Self-pay | Admitting: Orthopedic Surgery

## 2021-06-30 ENCOUNTER — Ambulatory Visit: Payer: BC Managed Care – PPO | Admitting: Orthopedic Surgery

## 2021-06-30 ENCOUNTER — Ambulatory Visit: Payer: BC Managed Care – PPO

## 2021-06-30 ENCOUNTER — Other Ambulatory Visit: Payer: Self-pay

## 2021-06-30 VITALS — BP 147/100 | HR 88 | Ht 68.0 in | Wt 243.6 lb

## 2021-06-30 DIAGNOSIS — M545 Low back pain, unspecified: Secondary | ICD-10-CM

## 2021-06-30 DIAGNOSIS — M5136 Other intervertebral disc degeneration, lumbar region: Secondary | ICD-10-CM

## 2021-06-30 MED ORDER — GABAPENTIN 100 MG PO CAPS: 100.0000 mg | ORAL_CAPSULE | Freq: Three times a day (TID) | ORAL | 2 refills | Status: DC

## 2021-06-30 MED ORDER — PREDNISONE 10 MG (48) PO TBPK: ORAL_TABLET | Freq: Every day | ORAL | 0 refills | Status: DC

## 2021-06-30 MED ORDER — TIZANIDINE HCL 4 MG PO TABS
4.0000 mg | ORAL_TABLET | Freq: Every day | ORAL | 1 refills | Status: DC
Start: 2021-06-30 — End: 2021-07-22

## 2021-06-30 NOTE — Patient Instructions (Addendum)
Avoid heavy lifting  While we are working on your approval for MRI please go ahead and call to schedule your appointment with [facility] within at least one (1) week.   Central Scheduling [phone]

## 2021-06-30 NOTE — Progress Notes (Signed)
Patient ID: Angel Powell, male   DOB: [birth year], 65 y.o.   MRN: [id number]    ASSESSMENT AND PLAN:  Angel Powell is had back pain for several years he has several x-rays of his lower back and confirms that his pain has been intermittent over the years but worse in the last 3 weeks now associated with radiating pain to his buttocks and thighs he has a history of bulging disks he is self medicated with Tylenol  Based on his positive straight leg raise and chronic symptoms with worsening put him on prednisone gabapentin and tizanidine and ordered MRI of his spine  Chief Complaint  Patient presents with   Back Pain    Lbp// no known injury    HPI Angel Powell is a 65 y.o. male.  Angel Powell is had back pain for several years he has several x-rays of his lower back and confirms that his pain has been intermittent over the years but worse in the last 3 weeks now associated with radiating pain to his buttocks and thighs he has a history of bulging disks he is self medicated with Tylenol   Review of Systems Review of Systems  Eyes:  Positive for visual disturbance.  Musculoskeletal:        Pain in legs after walking  Allergic/Immunologic: Positive for environmental allergies.  All other systems reviewed and are negative.  Past Medical History:  Diagnosis Date   GERD (gastroesophageal reflux disease)    Hypercholesteremia     Past Surgical History:  Procedure Laterality Date   bone spur     right   COLONOSCOPY  02/10/2007   Small internal hemorrhoids.  Otherwise normal colon.  Repeat in 10 years.   COLONOSCOPY WITH PROPOFOL N/A 07/25/2020   Procedure: COLONOSCOPY WITH PROPOFOL;  Surgeon: Angel Ade, MD;  [facility]: [facility];  Service: Endoscopy;  Laterality: N/A;  7:30am   ROTATOR CUFF REPAIR     left, [facility]; Angel Powell    Family History  Problem Relation Age of Onset   Colon cancer Neg Hx    was reviewed  Social History Social History   Tobacco Use   Smoking status:  Former    Packs/day: 1.00    Years: 0.50    Pack years: 0.50    Types: Cigarettes   Smokeless tobacco: Never  Substance Use Topics   Alcohol use: Not Currently    Comment: 2-3 beer every other day and a shot of liquor every other day; 05/13/20 "I quit"- last drank in June 2021   Drug use: No    No Known Allergies  Current Outpatient Medications  Medication Sig Dispense Refill   acetaminophen (TYLENOL) 500 MG tablet Take 500-1,000 mg by mouth every 6 (six) hours as needed (back pain.).     atorvastatin (LIPITOR) 20 MG tablet Take 20 mg by mouth daily.     Biotin w/ Vitamins C & E (HAIR/SKIN/NAILS PO) Take 1 tablet by mouth daily.     ibuprofen (ADVIL) 800 MG tablet Take 800 mg by mouth every 8 (eight) hours as needed.     indomethacin (INDOCIN) 25 MG capsule TAKE 1 CAPSULE BY MOUTH 3 TIMES DAILY WITH MEALS. 60 capsule 2   Menthol, Topical Analgesic, (BIOFREEZE EX) Apply 1 application topically 3 (three) times daily as needed (back pain.).     Multiple Vitamins-Minerals (MULTIVITAMIN WITH MINERALS) tablet Take 1 tablet by mouth daily.     naphazoline-glycerin (CLEAR EYES REDNESS) 0.012-0.2 % SOLN Place 1  drop into both eyes 4 (four) times daily as needed for eye irritation.     omega-3 acid ethyl esters (LOVAZA) 1 g capsule Take 1 g by mouth daily.     omeprazole (PRILOSEC) 40 MG capsule Take 40 mg by mouth daily.      Vitamin D, Ergocalciferol, (DRISDOL) 1.25 MG (50000 UNIT) CAPS capsule Take by mouth.     gabapentin (NEURONTIN) 100 MG capsule Take 1 capsule (100 mg total) by mouth 3 (three) times daily. 90 capsule 2   predniSONE (STERAPRED UNI-PAK 48 TAB) 10 MG (48) TBPK tablet Take by mouth daily. 10 mg ds 12 days as directed 48 tablet 0   tiZANidine (ZANAFLEX) 4 MG tablet Take 1 tablet (4 mg total) by mouth daily. 30 tablet 1   No current facility-administered medications for this visit.       Physical Exam BP (!) 147/100   Pulse 88   Ht 5\' 8"  (1.727 m)   Wt 243 lb 9.6 oz  (110.5 kg)   BMI 37.04 kg/m   Gen. appearance: The patient is well-developed and well-nourished grooming and hygiene are normal The patient is oriented to person place and time The patient's mood is normal and the affect is normal   Gait assessment: The patient stands with  normal gait and station  Lumbar spine Tenderness  to palpation is noted in the lower L4-5 and 5 S1 segment  Range of motion decreased flexion extension Muscle tone   normal on the right and left sides of the spine  Lower extremities right and left Normal range of motion hip knee and ankle All 3 joints are reduced and stable  Strength right lower extremity L2-S1 NORMAL Strength left lower extremity L2-S1 NORMAL  Neurologic right lower extremity examination  Reflexes were 2+ and equal at the knee and 1+ and equal at the ankle    Sensation was normal in both feet and legs    Babinski's tests were down going  Straight leg raise testing positive on the right at 60 degrees  The vascular examination revealed normal dorsalis pedis pulses in both feet and both feet were warm with good capillary refill    MEDICAL DECISION MAKING  A.  Encounter Diagnoses  Name Primary?   Lumbar pain    DDD (degenerative disc disease), lumbar Yes    B. DATA ANALYSED:  Images from 2020 show degenerative disc disease especially at L4-L5 with multilevel anterior osteophytes and narrowing of the lumbar disc    IMAGING: Independent interpretation of images: Today's x-ray shows a fairly modest scoliosis in the lumbar spine with an L4-5 disc space narrowing multiple levels of joint space narrowing as well as anterior spurs  Orders: MRI lumbar spine  Outside records reviewed: None  C. MANAGEMENT as above  Meds ordered this encounter  Medications   predniSONE (STERAPRED UNI-PAK 48 TAB) 10 MG (48) TBPK tablet    Sig: Take by mouth daily. 10 mg ds 12 days as directed    Dispense:  48 tablet    Refill:  0   gabapentin  (NEURONTIN) 100 MG capsule    Sig: Take 1 capsule (100 mg total) by mouth 3 (three) times daily.    Dispense:  90 capsule    Refill:  2   tiZANidine (ZANAFLEX) 4 MG tablet    Sig: Take 1 tablet (4 mg total) by mouth daily.    Dispense:  30 tablet    Refill:  1

## 2021-07-02 ENCOUNTER — Telehealth: Payer: Self-pay | Admitting: Orthopedic Surgery

## 2021-07-02 NOTE — Telephone Encounter (Signed)
Patient called about the medications prescribed by Dr Romeo Apple -  (1) States he had no instructions on the Prednisone which is noted in chart as: predniSONE (STERAPRED UNI-PAK 48 TAB) 10 MG (48) TBPK tablet 48 tablet  (2) States he is unable to take the Gabapentin   Pharmacy is [facility] in [place]

## 2021-07-02 NOTE — Telephone Encounter (Signed)
I told him where to find directions told him to d/c the Gabapentin since it makes his feet swell, told him there is not normally anything to replace it.

## 2021-07-04 ENCOUNTER — Telehealth: Payer: Self-pay | Admitting: Orthopedic Surgery

## 2021-07-04 NOTE — Telephone Encounter (Signed)
Angel Powell called and said that he cant take the Gabapentin.  He has a  friend that cant take Gabapentin either.  He said his friend takes Duloxetine and Cyclobenzaprine instead.  He wants to know if these medications were an option for himself?  Please advise  Thanks

## 2021-07-09 ENCOUNTER — Ambulatory Visit (HOSPITAL_COMMUNITY): Admission: RE | Admit: 2021-07-09 | Payer: BC Managed Care – PPO | Source: Ambulatory Visit

## 2021-07-16 ENCOUNTER — Other Ambulatory Visit: Payer: Self-pay

## 2021-07-16 ENCOUNTER — Other Ambulatory Visit (HOSPITAL_COMMUNITY)
Admission: RE | Admit: 2021-07-16 | Discharge: 2021-07-16 | Disposition: A | Discharge status: Home / Self Care | Payer: BC Managed Care – PPO | Source: Ambulatory Visit | Attending: Internal Medicine | Admitting: Internal Medicine

## 2021-07-16 DIAGNOSIS — N400 Enlarged prostate without lower urinary tract symptoms: Secondary | ICD-10-CM | POA: Diagnosis present

## 2021-07-16 DIAGNOSIS — N390 Urinary tract infection, site not specified: Secondary | ICD-10-CM | POA: Diagnosis present

## 2021-07-16 LAB — URINALYSIS, COMPLETE (UACMP) WITH MICROSCOPIC
Bacteria, UA: NONE SEEN
Bilirubin Urine: NEGATIVE
Glucose, UA: NEGATIVE mg/dL
Ketones, ur: NEGATIVE mg/dL
Nitrite: NEGATIVE
Protein, ur: 30 mg/dL — AB
RBC / HPF: >50 RBC/hpf (ref 0–5)
Specific Gravity, Urine: 1.015 (ref 1.005–1.030)
Squamous Epithelial / HPF: NONE SEEN (ref 0–5)
WBC, UA: >50 WBC/hpf (ref 0–5)
pH: 5.5 (ref 5.0–8.0)

## 2021-07-17 ENCOUNTER — Ambulatory Visit (HOSPITAL_COMMUNITY)
Admission: RE | Admit: 2021-07-17 | Discharge: 2021-07-17 | Disposition: A | Discharge status: Home / Self Care | Payer: BC Managed Care – PPO | Source: Ambulatory Visit | Attending: Orthopedic Surgery | Admitting: Orthopedic Surgery

## 2021-07-17 DIAGNOSIS — M5136 Other intervertebral disc degeneration, lumbar region: Secondary | ICD-10-CM | POA: Insufficient documentation

## 2021-07-17 DIAGNOSIS — M545 Low back pain, unspecified: Secondary | ICD-10-CM | POA: Diagnosis present

## 2021-07-17 LAB — PSA, TOTAL AND FREE
PSA, Free Pct: 14.8 %
PSA, Free: 2.14 ng/mL
Prostate Specific Ag, Serum: 14.5 ng/mL — ABNORMAL HIGH (ref 0.0–4.0)

## 2021-07-21 ENCOUNTER — Other Ambulatory Visit: Payer: Self-pay

## 2021-07-21 ENCOUNTER — Ambulatory Visit (INDEPENDENT_AMBULATORY_CARE_PROVIDER_SITE_OTHER): Payer: BC Managed Care – PPO | Admitting: Orthopedic Surgery

## 2021-07-21 ENCOUNTER — Other Ambulatory Visit: Payer: Self-pay | Admitting: Orthopedic Surgery

## 2021-07-21 DIAGNOSIS — M48062 Spinal stenosis, lumbar region with neurogenic claudication: Secondary | ICD-10-CM

## 2021-07-21 DIAGNOSIS — M5136 Other intervertebral disc degeneration, lumbar region: Secondary | ICD-10-CM

## 2021-07-21 MED ORDER — GABAPENTIN 100 MG PO CAPS
100.0000 mg | ORAL_CAPSULE | Freq: Three times a day (TID) | ORAL | 2 refills | Status: DC
Start: 2021-07-21 — End: 2022-10-01

## 2021-07-21 NOTE — Progress Notes (Signed)
Virtual Visit via Telephone Note  I connected with Angel Powell on 07/21/21 at  1:50 PM EDT by telephone and verified that I am speaking with the correct person using two identifiers.  Location: Patient: home  Provider: office    I discussed the limitations, risks, security and privacy concerns of performing an evaluation and management service by telephone and the availability of in person appointments. I also discussed with the patient that there may be a patient responsible charge related to this service. The patient expressed understanding and agreed to proceed.   History of Present Illness:  History of present illness [name] is had back pain for several years he has several x-rays of his lower back and confirms that his pain has been intermittent over the years but worse in the last 3 weeks now associated with radiating pain to his buttocks and thighs he has a history of bulging disks he is self medicated with Tylenol  Based on his positive straight leg raise and chronic symptoms with worsening put him on prednisone gabapentin and tizanidine and ordered MRI of his spine  He took a 12-day Sterapred Dosepak and then started on Neurontin 100 mg 3 times a day along with tizanidine.  He still having lower back and bilateral leg pain   Observations/Objective: Disc levels:   L1-2: Disc degeneration with disc bulging and diffuse endplate spurring. Mild foraminal narrowing bilaterally   L2-3: Disc degeneration with Schmorl's nodes and diffuse endplate spurring. Moderate to severe subarticular and foraminal stenosis on the left. Mild right subarticular stenosis   L3-4: Disc degeneration with Schmorl's node. Disc bulging and spurring. Moderate to severe left foraminal encroachment. Moderate left subarticular stenosis. Moderate subarticular and foraminal stenosis on the right   L4-5: Disc degeneration with Schmorl's node. Diffuse disc bulging and endplate spurring. Bilateral facet  degeneration. Mild spinal stenosis. Moderate subarticular and foraminal stenosis bilaterally   L5-S1: Disc degeneration with disc bulging and endplate spurring, right greater than left. Moderate right subarticular and foraminal stenosis. Mild left subarticular stenosis. Bilateral facet degeneration.   IMPRESSION: Multilevel spondylosis, with progression since 2012. Negative for fracture   Multilevel   foraminal stenosis due to spurring.     Electronically Signed   By: Angel Powell M.D.   On: 07/17/2021 16:01  Assessment and Plan:  Multilevel DDD Lumbar    Meds ordered this encounter  Medications   gabapentin (NEURONTIN) 100 MG capsule    Sig: Take 1 capsule (100 mg total) by mouth 3 (three) times daily.    Dispense:  90 capsule    Refill:  2     Follow Up Instructions:    I discussed the assessment and treatment plan with the patient. The patient was provided an opportunity to ask questions and all were answered. The patient agreed with the plan and demonstrated an understanding of the instructions.   The patient was advised to call back or seek an in-person evaluation if the symptoms worsen or if the condition fails to improve as anticipated.  I provided MRI review, report review, phone conversation a total 20 minutes of non-face-to-face time during this encounter.  I am referring the patient to a spine specialist for treatment Fuller Canada, MD

## 2021-07-22 ENCOUNTER — Other Ambulatory Visit: Payer: Self-pay | Admitting: Orthopedic Surgery

## 2021-07-22 DIAGNOSIS — M51369 Other intervertebral disc degeneration, lumbar region without mention of lumbar back pain or lower extremity pain: Secondary | ICD-10-CM

## 2021-07-22 DIAGNOSIS — M5136 Other intervertebral disc degeneration, lumbar region: Secondary | ICD-10-CM

## 2021-07-29 NOTE — Progress Notes (Signed)
Assessment: 1. Prostatitis, unspecified prostatitis type   2. Elevated PSA      Plan: His elevated PSA was likely associated with his recent episode of prostatitis. Request records from Dr. Felecia Shelling for review. Recommend Bactrim DS twice daily x 2 weeks for continued treatment of prostatitis. Return to office in 6 weeks. Will need repeat PSA after resolution of prostatitis.  Chief Complaint:  Chief Complaint  Patient presents with   Elevated PSA    History of Present Illness:  Angel Powell is a 65 y.o. year old male who is seen in consultation from Avon Gully, MD for evaluation of elevated PSA. PSA from 07/17/21 was 14.5 with 14.8% free.  No prior levels available for comparison. He was having significant urinary symptoms including dysuria at the time.  No gross hematuria or flank pain.  He was diagnosed with prostatitis and treated with antibiotics.  His lower inner tract symptoms improved.  He has not having any significant lower urinary tract symptoms today.  No prior history of prostatitis or UTIs.  No family history of prostate cancer. AUA score = 4 today.   Past Medical History:  Past Medical History:  Diagnosis Date   GERD (gastroesophageal reflux disease)    Hypercholesteremia     Past Surgical History:  Past Surgical History:  Procedure Laterality Date   bone spur     right   COLONOSCOPY  02/10/2007   Small internal hemorrhoids.  Otherwise normal colon.  Repeat in 10 years.   COLONOSCOPY WITH PROPOFOL N/A 07/25/2020   Procedure: COLONOSCOPY WITH PROPOFOL;  Surgeon: Corbin Ade, MD;  [facility]: [facility];  Service: Endoscopy;  Laterality: N/A;  7:30am   ROTATOR CUFF REPAIR     left, [facility]; [place]    Allergies:  No Known Allergies  Family History:  Family History  Problem Relation Age of Onset   Colon cancer Neg Hx     Social History:  Social History   Tobacco Use   Smoking status: Former    Packs/day: 1.00    Years: 0.50    Pack years:  0.50    Types: Cigarettes   Smokeless tobacco: Never  Substance Use Topics   Alcohol use: Not Currently    Comment: 2-3 beer every other day and a shot of liquor every other day; 05/13/20 "I quit"- last drank in June 2021   Drug use: No    Review of symptoms:  Constitutional:  Negative for unexplained weight loss, night sweats, fever, chills ENT:  Negative for nose bleeds, sinus pain, painful swallowing CV:  Negative for chest pain, shortness of breath, exercise intolerance, palpitations, loss of consciousness Resp:  Negative for cough, wheezing, shortness of breath GI:  Negative for nausea, vomiting, diarrhea, bloody stools GU:  Positives noted in HPI; otherwise negative for gross hematuria, urinary incontinence Neuro:  Negative for seizures, poor balance, limb weakness, slurred speech Psych:  Negative for lack of energy, depression, anxiety Endocrine:  Negative for polydipsia, polyuria, symptoms of hypoglycemia (dizziness, hunger, sweating) Hematologic:  Negative for anemia, purpura, petechia, prolonged or excessive bleeding, use of anticoagulants  Allergic:  Negative for difficulty breathing or choking as a result of exposure to anything; no shellfish allergy; no allergic response (rash/itch) to materials, foods  Physical exam: BP 138/85   Pulse 91   Temp 99.2 F (37.3 C)   Wt 247 lb 6.4 oz (112.2 kg)   BMI 37.62 kg/m  GENERAL APPEARANCE:  Well appearing, well developed, well nourished, NAD HEENT: Atraumatic,  Normocephalic, oropharynx clear. NECK: Supple without lymphadenopathy or thyromegaly. LUNGS: Clear to auscultation bilaterally. HEART: Regular Rate and Rhythm without murmurs, gallops, or rubs. ABDOMEN: Soft, non-tender, No Masses. EXTREMITIES: Moves all extremities well.  Without clubbing, cyanosis, or edema. NEUROLOGIC:  Alert and oriented x 3, normal gait, CN II-XII grossly intact.  MENTAL STATUS:  Appropriate. BACK:  Non-tender to palpation.  No CVAT SKIN:  Warm,  dry and intact.   GU: Penis:  uncircumcised Meatus: Normal Scrotum: normal, no masses Testis: normal without masses bilateral Epididymis: normal Prostate: 40 g, minimally tender, no nodules Rectum: normal   Results: U/A:  11-30 WBC, 0-2 RBC, 0-10 epithelial cells, few bacteria

## 2021-07-30 ENCOUNTER — Ambulatory Visit (INDEPENDENT_AMBULATORY_CARE_PROVIDER_SITE_OTHER): Payer: BC Managed Care – PPO | Admitting: Urology

## 2021-07-30 ENCOUNTER — Other Ambulatory Visit: Payer: Self-pay

## 2021-07-30 ENCOUNTER — Encounter: Payer: Self-pay | Admitting: Urology

## 2021-07-30 VITALS — BP 138/85 | HR 91 | Temp 99.2°F | Wt 247.4 lb

## 2021-07-30 DIAGNOSIS — N419 Inflammatory disease of prostate, unspecified: Secondary | ICD-10-CM | POA: Diagnosis not present

## 2021-07-30 DIAGNOSIS — R972 Elevated prostate specific antigen [PSA]: Secondary | ICD-10-CM | POA: Diagnosis not present

## 2021-07-30 LAB — URINALYSIS, ROUTINE W REFLEX MICROSCOPIC
Bilirubin, UA: NEGATIVE
Glucose, UA: NEGATIVE
Ketones, UA: NEGATIVE
Nitrite, UA: NEGATIVE
Protein,UA: NEGATIVE
RBC, UA: NEGATIVE
Specific Gravity, UA: 1.02 (ref 1.005–1.030)
Urobilinogen, Ur: 1 mg/dL (ref 0.2–1.0)
pH, UA: 6.5 (ref 5.0–7.5)

## 2021-07-30 LAB — MICROSCOPIC EXAMINATION: Renal Epithel, UA: NONE SEEN /hpf

## 2021-07-30 MED ORDER — SULFAMETHOXAZOLE-TRIMETHOPRIM 800-160 MG PO TABS: 1.0000 | ORAL_TABLET | Freq: Two times a day (BID) | ORAL | 0 refills | Status: AC

## 2021-07-30 NOTE — Progress Notes (Signed)
Urological Symptom Review  Patient is experiencing the following symptoms: Frequent urination Hard to postpone urination Burning/pain with urination Get up at night to urinate Leakage of urine Stream starts and stops Trouble starting stream Have to strain to urinate Weak stream Erection problems (male only)   Review of Systems  Gastrointestinal (upper)  : Negative for upper GI symptoms  Gastrointestinal (lower) : Constipation  Constitutional : Negative for symptoms  Skin: Skin rash/lesion  Eyes: Blurred vision  Ear/Nose/Throat : Sinus problems  Hematologic/Lymphatic: Negative for Hematologic/Lymphatic symptoms  Cardiovascular : Negative for cardiovascular symptoms  Respiratory : Shortness of breath  Endocrine: Negative for endocrine symptoms  Musculoskeletal: Back pain Joint pain  Neurological: Negative for neurological symptoms  Psychologic: Negative for psychiatric symptoms

## 2021-07-30 NOTE — Addendum Note (Signed)
Addended by: Ferdinand Lango on: 07/30/2021 04:02 PM   Modules accepted: Orders

## 2021-07-31 ENCOUNTER — Ambulatory Visit (INDEPENDENT_AMBULATORY_CARE_PROVIDER_SITE_OTHER): Payer: BC Managed Care – PPO | Admitting: Orthopaedic Surgery

## 2021-07-31 DIAGNOSIS — M5126 Other intervertebral disc displacement, lumbar region: Secondary | ICD-10-CM | POA: Diagnosis not present

## 2021-07-31 DIAGNOSIS — M4726 Other spondylosis with radiculopathy, lumbar region: Secondary | ICD-10-CM | POA: Diagnosis not present

## 2021-07-31 NOTE — Addendum Note (Signed)
Addended by: Rogers Seeds on: 07/31/2021 09:53 AM   Modules accepted: Orders

## 2021-07-31 NOTE — Progress Notes (Signed)
Office Visit Note   Patient: Angel Powell           Date of Birth: 07-05-1956           MRN: [id number] Visit Date: 07/31/2021              Requested by: Vickki Hearing, MD [address] [place],  [place] [address] PCP: Avon Gully, MD   Assessment & Plan: Visit Diagnoses:  1. Other spondylosis with radiculopathy, lumbar region   2. Protrusion of lumbar intervertebral disc     Plan: Patient has multilevel lumbar spondylosis with moderate subarticular foraminal stenosis due to disc bulge.  We will set him up for single injection at that level which is her most symptomatic spine.  He can return to see me in 3 months continue work activity.  I discussed that at this point surgery is not indicated.  We can follow him up in 3 months.  Follow-Up Instructions: No follow-ups on file.   Orders:  No orders of the defined types were placed in this encounter.  No orders of the defined types were placed in this encounter.     Procedures: No procedures performed   Clinical Data: No additional findings.   Subjective: Chief Complaint  Patient presents with   Lower Back - Pain    HPI 65 year old male referred to me by Dr. Valentina Shaggy for problems with his back multilevel spondylosis.  Patient works at [employer] is on his feet while working.  He has had catching in his back back pain.  He has been on gabapentin prednisone Dosepak exercise program, anti-inflammatories muscle relaxant without relief he states his pain is 8 out of 10 at times.  Sometimes he has claudication symptoms more pain on the right leg than left leg.  Denies chills or fever.  No history of gout attacks.  Patient's had a history of epidural injections which worked well many years ago.  MRI shows multilevel spondylosis with disc protrusion on the right at L5-S1.  Endplate degenerative changes at L2-3 and several levels of mild disc bulge in the lumbar spine L2-3, L3-4 with spurring of L4-5.  No bowel  bladder problems.  He denies chills or fevers or  Review of Systems has history of rotator cuff problems GERD.  Epidurals in the past for lumbar pain.   Objective: Vital Signs: Ht 5\' 9"  (1.753 m)   Wt 245 lb (111.1 kg)   BMI 36.18 kg/m   Physical Exam Constitutional:      Appearance: He is well-developed.  HENT:     Head: Normocephalic and atraumatic.     Right Ear: External ear normal.     Left Ear: External ear normal.  Eyes:     Pupils: Pupils are equal, round, and reactive to light.  Neck:     Thyroid: No thyromegaly.     Trachea: No tracheal deviation.  Cardiovascular:     Rate and Rhythm: Normal rate.  Pulmonary:     Effort: Pulmonary effort is normal.     Breath sounds: No wheezing.  Abdominal:     General: Bowel sounds are normal.     Palpations: Abdomen is soft.  Musculoskeletal:     Cervical back: Neck supple.  Skin:    General: Skin is warm and dry.     Capillary Refill: Capillary refill takes less than 2 seconds.  Neurological:     Mental Status: He is alert and oriented to person, place, and time.  Psychiatric:        Behavior: Behavior normal.        Thought Content: Thought content normal.        Judgment: Judgment normal.    Ortho Exam patient is able heel and toe walk he has pain with straight leg raising mild sciatic notch tenderness on the left more prominent on the right anterior tib EHL is strong pain with positive compression mild trochanteric bursal tenderness negative logroll the hips reflexes are 1+ and symmetrical knee and ankle jerk.  Specialty Comments:  No specialty comments available.  Imaging: No results found. Narrative & Impression  CLINICAL DATA:  Low back pain bilateral radiculopathy   EXAM: MRI LUMBAR SPINE WITHOUT CONTRAST   TECHNIQUE: Multiplanar, multisequence MR imaging of the lumbar spine was performed. No intravenous contrast was administered.   COMPARISON:  Lumbar spine radiographs 06/30/2021. Lumbar  MRI 03/13/2011   FINDINGS: Segmentation:  Standard   Alignment:  Mild dextroscoliosis.  Mild retrolisthesis L1-2.   Vertebrae: Disc degeneration with multiple Schmorl's nodes and endplate changes. No fracture or mass lesion.   Conus medullaris and cauda equina: Conus extends to the L1-2 level. Conus and cauda equina appear normal.   Paraspinal and other soft tissues: Negative for paraspinous mass, adenopathy, or fluid collection   Disc levels:   L1-2: Disc degeneration with disc bulging and diffuse endplate spurring. Mild foraminal narrowing bilaterally   L2-3: Disc degeneration with Schmorl's nodes and diffuse endplate spurring. Moderate to severe subarticular and foraminal stenosis on the left. Mild right subarticular stenosis   L3-4: Disc degeneration with Schmorl's node. Disc bulging and spurring. Moderate to severe left foraminal encroachment. Moderate left subarticular stenosis. Moderate subarticular and foraminal stenosis on the right   L4-5: Disc degeneration with Schmorl's node. Diffuse disc bulging and endplate spurring. Bilateral facet degeneration. Mild spinal stenosis. Moderate subarticular and foraminal stenosis bilaterally   L5-S1: Disc degeneration with disc bulging and endplate spurring, right greater than left. Moderate right subarticular and foraminal stenosis. Mild left subarticular stenosis. Bilateral facet degeneration.   IMPRESSION: Multilevel spondylosis, with progression since 2012. Negative for fracture   Multilevel   foraminal stenosis due to spurring.     Electronically Signed   By: Marlan Palau M.D.   On: 07/17/2021 16:01     PMFS History: Patient Active Problem List   Diagnosis Date Noted   Other spondylosis with radiculopathy, lumbar region 07/31/2021   Protrusion of lumbar intervertebral disc 07/31/2021   Elevated LFTs 11/15/2019   Colon cancer screening 11/15/2019   GERD (gastroesophageal reflux disease) 11/15/2019    RUPTURE ROTATOR CUFF 01/07/2009   DEGENERATIVE JOINT DISEASE, RIGHT SHOULDER 12/05/2007   SHOULDER PAIN 12/05/2007   Past Medical History:  Diagnosis Date   GERD (gastroesophageal reflux disease)    Hypercholesteremia     Family History  Problem Relation Age of Onset   Colon cancer Neg Hx     Past Surgical History:  Procedure Laterality Date   bone spur     right   COLONOSCOPY  02/10/2007   Small internal hemorrhoids.  Otherwise normal colon.  Repeat in 10 years.   COLONOSCOPY WITH PROPOFOL N/A 07/25/2020   Procedure: COLONOSCOPY WITH PROPOFOL;  Surgeon: Corbin Ade, MD;  [facility]: [facility];  Service: Endoscopy;  Laterality: N/A;  7:30am   ROTATOR CUFF REPAIR     left, [facility]; Romeo Apple   Social History   Occupational History   Not on file  Tobacco Use   Smoking status: Former  Packs/day: 1.00    Years: 0.50    Pack years: 0.50    Types: Cigarettes   Smokeless tobacco: Never  Substance and Sexual Activity   Alcohol use: Not Currently    Comment: 2-3 beer every other day and a shot of liquor every other day; 05/13/20 "I quit"- last drank in June 2021   Drug use: No   Sexual activity: Not on file

## 2021-08-04 ENCOUNTER — Telehealth: Payer: Self-pay | Admitting: Physical Medicine and Rehabilitation

## 2021-08-04 NOTE — Telephone Encounter (Signed)
Patient called. Returning a call to [place] 

## 2021-08-04 NOTE — Telephone Encounter (Signed)
Pt returned shenas call to get scheduled.  CB [phone]

## 2021-08-15 ENCOUNTER — Other Ambulatory Visit (HOSPITAL_COMMUNITY): Payer: Self-pay | Admitting: Gerontology

## 2021-08-15 ENCOUNTER — Other Ambulatory Visit: Payer: Self-pay

## 2021-08-15 ENCOUNTER — Ambulatory Visit (HOSPITAL_COMMUNITY)
Admission: RE | Admit: 2021-08-15 | Discharge: 2021-08-15 | Disposition: A | Discharge status: Home / Self Care | Payer: BC Managed Care – PPO | Source: Ambulatory Visit | Attending: Gerontology | Admitting: Gerontology

## 2021-08-15 DIAGNOSIS — R0602 Shortness of breath: Secondary | ICD-10-CM | POA: Diagnosis present

## 2021-08-21 ENCOUNTER — Other Ambulatory Visit: Payer: Self-pay

## 2021-08-21 ENCOUNTER — Encounter: Payer: Self-pay | Admitting: Physical Medicine and Rehabilitation

## 2021-08-21 ENCOUNTER — Ambulatory Visit: Payer: Self-pay

## 2021-08-21 ENCOUNTER — Other Ambulatory Visit: Payer: Self-pay | Admitting: Orthopedic Surgery

## 2021-08-21 ENCOUNTER — Ambulatory Visit (INDEPENDENT_AMBULATORY_CARE_PROVIDER_SITE_OTHER): Payer: BC Managed Care – PPO | Admitting: Physical Medicine and Rehabilitation

## 2021-08-21 VITALS — BP 146/89 | HR 90

## 2021-08-21 DIAGNOSIS — M5416 Radiculopathy, lumbar region: Secondary | ICD-10-CM | POA: Diagnosis not present

## 2021-08-21 DIAGNOSIS — M5136 Other intervertebral disc degeneration, lumbar region: Secondary | ICD-10-CM

## 2021-08-21 MED ORDER — BETAMETHASONE SOD PHOS & ACET 6 (3-3) MG/ML IJ SUSP
12.0000 mg | Freq: Once | INTRAMUSCULAR | Status: AC
  Administered 2021-08-21: 12 mg

## 2021-08-21 NOTE — Progress Notes (Signed)
Pt state lower back pain that travels down both legs. Pt state sitting makes the pain worse. Pt state he takes pain meds and uses heating to help ease his pain.  Numeric Pain Rating Scale and Functional Assessment Average Pain 3   In the last MONTH (on 0-10 scale) has pain interfered with the following?  1. General activity like being  able to carry out your everyday physical activities such as walking, climbing stairs, carrying groceries, or moving a chair?  Rating(10)   +Driver, -BT, -Dye Allergies.

## 2021-08-21 NOTE — Patient Instructions (Signed)

## 2021-08-23 NOTE — Procedures (Signed)
Lumbar Epidural Steroid Injection - Interlaminar Approach with Fluoroscopic Guidance  Patient: Angel Powell      Date of Birth: Jun 17, 1956 MRN: [id number] PCP: Avon Gully, MD      Visit Date: 08/21/2021   Universal Protocol:     Consent Given By: the patient  Position: PRONE  Additional Comments: Vital signs were monitored before and after the procedure. Patient was prepped and draped in the usual sterile fashion. The correct patient, procedure, and site was verified.   Injection Procedure Details:   Procedure diagnoses: Lumbar radiculopathy [M54.16]   Meds Administered:  Meds ordered this encounter  Medications   betamethasone acetate-betamethasone sodium phosphate (CELESTONE) injection 12 mg     Laterality: Right  Location/Site:  L5-S1  Needle: 3.5 in., 20 ga. Tuohy  Needle Placement: Paramedian epidural  Findings:   -Comments: Excellent flow of contrast into the epidural space.  Procedure Details: Using a paramedian approach from the side mentioned above, the region overlying the inferior lamina was localized under fluoroscopic visualization and the soft tissues overlying this structure were infiltrated with 4 ml. of 1% Lidocaine without Epinephrine. The Tuohy needle was inserted into the epidural space using a paramedian approach.   The epidural space was localized using loss of resistance along with counter oblique bi-planar fluoroscopic views.  After negative aspirate for air, blood, and CSF, a 2 ml. volume of Isovue-250 was injected into the epidural space and the flow of contrast was observed. Radiographs were obtained for documentation purposes.    The injectate was administered into the level noted above.   Additional Comments:  The patient tolerated the procedure well Dressing: 2 x 2 sterile gauze and Band-Aid    Post-procedure details: Patient was observed during the procedure. Post-procedure instructions were reviewed.  Patient left the clinic  in stable condition.

## 2021-08-23 NOTE — Progress Notes (Signed)
[facility] - 64 y.o. male MRN [id number]  Date of birth: 1956/03/06  Office Visit Note: Visit Date: 08/21/2021 PCP: Avon Gully, MD Referred by: Avon Gully, MD  Subjective: Chief Complaint  Patient presents with   Lower Back - Pain   Left Leg - Pain   Right Leg - Pain   HPI:  JACCOB CZAPLICKI is a 65 y.o. male who comes in today at the request of Dr. Annell Greening for planned Right L5-S1 Lumbar Interlaminar epidural steroid injection with fluoroscopic guidance.  The patient has failed conservative care including home exercise, medications, time and activity modification.  This injection will be diagnostic and hopefully therapeutic.  Please see requesting physician notes for further details and justification.  Depending on relief consider bilateral S1 transforaminal injection versus facet joint block.  ROS Otherwise per HPI.  Assessment & Plan: Visit Diagnoses:    ICD-10-CM   1. Lumbar radiculopathy  M54.16 XR C-ARM NO REPORT    Epidural Steroid injection    betamethasone acetate-betamethasone sodium phosphate (CELESTONE) injection 12 mg      Plan: No additional findings.   Meds & Orders:  Meds ordered this encounter  Medications   betamethasone acetate-betamethasone sodium phosphate (CELESTONE) injection 12 mg    Orders Placed This Encounter  Procedures   XR C-ARM NO REPORT   Epidural Steroid injection    Follow-up: Return for visit to requesting physician as needed.   Procedures: No procedures performed  Lumbar Epidural Steroid Injection - Interlaminar Approach with Fluoroscopic Guidance  Patient: ROURKE MCQUITTY      Date of Birth: July 31, 1956 MRN: [id number] PCP: Avon Gully, MD      Visit Date: 08/21/2021   Universal Protocol:     Consent Given By: the patient  Position: PRONE  Additional Comments: Vital signs were monitored before and after the procedure. Patient was prepped and draped in the usual sterile fashion. The correct patient, procedure,  and site was verified.   Injection Procedure Details:   Procedure diagnoses: Lumbar radiculopathy [M54.16]   Meds Administered:  Meds ordered this encounter  Medications   betamethasone acetate-betamethasone sodium phosphate (CELESTONE) injection 12 mg     Laterality: Right  Location/Site:  L5-S1  Needle: 3.5 in., 20 ga. Tuohy  Needle Placement: Paramedian epidural  Findings:   -Comments: Excellent flow of contrast into the epidural space.  Procedure Details: Using a paramedian approach from the side mentioned above, the region overlying the inferior lamina was localized under fluoroscopic visualization and the soft tissues overlying this structure were infiltrated with 4 ml. of 1% Lidocaine without Epinephrine. The Tuohy needle was inserted into the epidural space using a paramedian approach.   The epidural space was localized using loss of resistance along with counter oblique bi-planar fluoroscopic views.  After negative aspirate for air, blood, and CSF, a 2 ml. volume of Isovue-250 was injected into the epidural space and the flow of contrast was observed. Radiographs were obtained for documentation purposes.    The injectate was administered into the level noted above.   Additional Comments:  The patient tolerated the procedure well Dressing: 2 x 2 sterile gauze and Band-Aid    Post-procedure details: Patient was observed during the procedure. Post-procedure instructions were reviewed.  Patient left the clinic in stable condition.    Clinical History: MRI LUMBAR SPINE WITHOUT CONTRAST   TECHNIQUE: Multiplanar, multisequence MR imaging of the lumbar spine was performed. No intravenous contrast was administered.   COMPARISON:  Lumbar spine radiographs  06/30/2021. Lumbar MRI 03/13/2011   FINDINGS: Segmentation:  Standard   Alignment:  Mild dextroscoliosis.  Mild retrolisthesis L1-2.   Vertebrae: Disc degeneration with multiple Schmorl's nodes and endplate  changes. No fracture or mass lesion.   Conus medullaris and cauda equina: Conus extends to the L1-2 level. Conus and cauda equina appear normal.   Paraspinal and other soft tissues: Negative for paraspinous mass, adenopathy, or fluid collection   Disc levels:   L1-2: Disc degeneration with disc bulging and diffuse endplate spurring. Mild foraminal narrowing bilaterally   L2-3: Disc degeneration with Schmorl's nodes and diffuse endplate spurring. Moderate to severe subarticular and foraminal stenosis on the left. Mild right subarticular stenosis   L3-4: Disc degeneration with Schmorl's node. Disc bulging and spurring. Moderate to severe left foraminal encroachment. Moderate left subarticular stenosis. Moderate subarticular and foraminal stenosis on the right   L4-5: Disc degeneration with Schmorl's node. Diffuse disc bulging and endplate spurring. Bilateral facet degeneration. Mild spinal stenosis. Moderate subarticular and foraminal stenosis bilaterally   L5-S1: Disc degeneration with disc bulging and endplate spurring, right greater than left. Moderate right subarticular and foraminal stenosis. Mild left subarticular stenosis. Bilateral facet degeneration.   IMPRESSION: Multilevel spondylosis, with progression since 2012. Negative for fracture   Multilevel   foraminal stenosis due to spurring.     Electronically Signed   By: Marlan Palau M.D.   On: 07/17/2021 16:01     Objective:  VS:  HT:    WT:   BMI:     BP:(!) 146/89  HR:90bpm  TEMP: ( )  RESP:  Physical Exam Vitals and nursing note reviewed.  Constitutional:      General: He is not in acute distress.    Appearance: Normal appearance. He is not ill-appearing.  HENT:     Head: Normocephalic and atraumatic.     Right Ear: External ear normal.     Left Ear: External ear normal.     Nose: No congestion.  Eyes:     Extraocular Movements: Extraocular movements intact.  Cardiovascular:     Rate and  Rhythm: Normal rate.     Pulses: Normal pulses.  Pulmonary:     Effort: Pulmonary effort is normal. No respiratory distress.  Abdominal:     General: There is no distension.     Palpations: Abdomen is soft.  Musculoskeletal:        General: No tenderness or signs of injury.     Cervical back: Neck supple.     Right lower leg: No edema.     Left lower leg: No edema.     Comments: Patient has good distal strength without clonus.  Skin:    Findings: No erythema or rash.  Neurological:     General: No focal deficit present.     Mental Status: He is alert and oriented to person, place, and time.     Sensory: No sensory deficit.     Motor: No weakness or abnormal muscle tone.     Coordination: Coordination normal.  Psychiatric:        Mood and Affect: Mood normal.        Behavior: Behavior normal.     Imaging: No results found.

## 2021-09-03 ENCOUNTER — Telehealth: Payer: Self-pay | Admitting: Orthopedic Surgery

## 2021-09-03 NOTE — Telephone Encounter (Signed)
Angel Powell is calling in to advise that his last ESI has not given him any relief.  He would like to know what his next step would be.  Should he try another injection?

## 2021-09-03 NOTE — Telephone Encounter (Signed)
Right L5-S1 IL on 11/3. Please advise.

## 2021-09-04 NOTE — Telephone Encounter (Signed)
Left message #1

## 2021-09-05 ENCOUNTER — Telehealth: Payer: Self-pay | Admitting: Physical Medicine and Rehabilitation

## 2021-09-05 NOTE — Telephone Encounter (Signed)
Pt called stated he received an injection last week and it did not help. Pt is asking for a call back to discuss next steps. Please call pt at [phone].

## 2021-09-05 NOTE — Telephone Encounter (Signed)
Patient called back. I returned his call and left message #2. 

## 2021-09-05 NOTE — Telephone Encounter (Signed)
See previous message

## 2021-09-08 ENCOUNTER — Telehealth: Payer: Self-pay

## 2021-09-08 NOTE — Telephone Encounter (Signed)
Returned patient's call. Call rang once and went to voicemail. Left message #3.

## 2021-09-08 NOTE — Telephone Encounter (Signed)
Pt called again and would like a call back and stating that he called this am and would like a call back

## 2021-09-08 NOTE — Telephone Encounter (Signed)
Pt called returning a message from [email or web address]  Please advise

## 2021-09-08 NOTE — Telephone Encounter (Signed)
See previous message

## 2021-09-09 ENCOUNTER — Telehealth: Payer: Self-pay | Admitting: Physical Medicine and Rehabilitation

## 2021-09-09 NOTE — Telephone Encounter (Signed)
Left message #4.  

## 2021-09-09 NOTE — Telephone Encounter (Signed)
Patient called advised the injection that he had 3 weeks ago did not work. Patient asked what is his next plan of care? The number to contact patient is [phone]

## 2021-09-09 NOTE — Telephone Encounter (Signed)
See previous message. Left message #4.

## 2021-09-10 ENCOUNTER — Encounter: Payer: Self-pay | Admitting: Physical Medicine and Rehabilitation

## 2021-09-10 ENCOUNTER — Ambulatory Visit (INDEPENDENT_AMBULATORY_CARE_PROVIDER_SITE_OTHER): Payer: BC Managed Care – PPO | Admitting: Urology

## 2021-09-10 ENCOUNTER — Other Ambulatory Visit: Payer: Self-pay

## 2021-09-10 ENCOUNTER — Encounter: Payer: Self-pay | Admitting: Urology

## 2021-09-10 VITALS — BP 146/93 | HR 96

## 2021-09-10 DIAGNOSIS — R972 Elevated prostate specific antigen [PSA]: Secondary | ICD-10-CM

## 2021-09-10 DIAGNOSIS — R8290 Unspecified abnormal findings in urine: Secondary | ICD-10-CM

## 2021-09-10 DIAGNOSIS — N419 Inflammatory disease of prostate, unspecified: Secondary | ICD-10-CM

## 2021-09-10 LAB — URINALYSIS, ROUTINE W REFLEX MICROSCOPIC
Bilirubin, UA: NEGATIVE
Glucose, UA: NEGATIVE
Nitrite, UA: NEGATIVE
Protein,UA: NEGATIVE
RBC, UA: NEGATIVE
Specific Gravity, UA: 1.02 (ref 1.005–1.030)
Urobilinogen, Ur: 2 mg/dL — ABNORMAL HIGH (ref 0.2–1.0)
pH, UA: 7.5 (ref 5.0–7.5)

## 2021-09-10 LAB — MICROSCOPIC EXAMINATION
RBC, Urine: NONE SEEN /hpf (ref 0–2)
Renal Epithel, UA: NONE SEEN /hpf

## 2021-09-10 NOTE — Progress Notes (Signed)

## 2021-09-10 NOTE — Progress Notes (Signed)
Assessment: 1. Elevated PSA   2. Prostatitis, unspecified prostatitis type      Plan: Urine culture today Will hold off on PSA given possible UTI. Will call with results and to arrange next visit  Chief Complaint:  Chief Complaint  Patient presents with   Elevated PSA     History of Present Illness:  Angel Powell is a 65 y.o. year old male who is seen for further evaluation of elevated PSA and prostatitis. PSA from 07/17/21 was 14.5 with 14.8% free.  No prior levels available for comparison. He was having significant urinary symptoms including dysuria at the time his PSA was drawn.  No gross hematuria or flank pain.  He was diagnosed with prostatitis and treated with antibiotics.  His lower inner tract symptoms improved.  He was not having any significant lower urinary tract symptoms at his visit on 07/30/21.  No prior history of prostatitis or UTIs.  No family history of prostate cancer. AUA score = 4. His exam was consistent with prostatitis.  He was treated with Bactrim for 2 weeks.  He returns today for follow-up.  He completed the Bactrim approximately 4 weeks ago.  He is not having any urinary symptoms at the present time.  No dysuria or gross hematuria.  Portions of the above documentation were copied from a prior visit for review purposes only.   Past Medical History:  Past Medical History:  Diagnosis Date   GERD (gastroesophageal reflux disease)    Hypercholesteremia     Past Surgical History:  Past Surgical History:  Procedure Laterality Date   bone spur     right   COLONOSCOPY  02/10/2007   Small internal hemorrhoids.  Otherwise normal colon.  Repeat in 10 years.   COLONOSCOPY WITH PROPOFOL N/A 07/25/2020   Procedure: COLONOSCOPY WITH PROPOFOL;  Surgeon: Corbin Ade, MD;  Location: [facility];  Service: Endoscopy;  Laterality: N/A;  7:30am   ROTATOR CUFF REPAIR     left, [facility]; [place]    Allergies:  No Known Allergies  Family History:  Family  History  Problem Relation Age of Onset   Colon cancer Neg Hx     Social History:  Social History   Tobacco Use   Smoking status: Former    Packs/day: 1.00    Years: 0.50    Pack years: 0.50    Types: Cigarettes   Smokeless tobacco: Never  Substance Use Topics   Alcohol use: Not Currently    Comment: 2-3 beer every other day and a shot of liquor every other day; 05/13/20 "I quit"- last drank in June 2021   Drug use: No    ROS: Constitutional:  Negative for fever, chills, weight loss CV: Negative for chest pain, previous MI, hypertension Respiratory:  Negative for shortness of breath, wheezing, sleep apnea, frequent cough GI:  Negative for nausea, vomiting, bloody stool, GERD  Physical exam: BP (!) 146/93   Pulse 96  GENERAL APPEARANCE:  Well appearing, well developed, well nourished, NAD HEENT:  Atraumatic, normocephalic, oropharynx clear NECK:  Supple without lymphadenopathy or thyromegaly ABDOMEN:  Soft, non-tender, no masses EXTREMITIES:  Moves all extremities well, without clubbing, cyanosis, or edema NEUROLOGIC:  Alert and oriented x 3, normal gait, CN II-XII grossly intact MENTAL STATUS:  appropriate BACK:  Non-tender to palpation, No CVAT SKIN:  Warm, dry, and intact  Results: U/A:  6-10 WBC, many bacteria

## 2021-09-10 NOTE — Telephone Encounter (Signed)
Pt called back and would like to speak with someone about what's next?

## 2021-09-15 NOTE — Telephone Encounter (Signed)
Tried calling. Went straight to VM. LM advising to call back to schedule OV

## 2021-09-17 ENCOUNTER — Telehealth: Payer: Self-pay | Admitting: Orthopedic Surgery

## 2021-09-17 NOTE — Telephone Encounter (Signed)
Patient called (ph# [phone]) to relay what has transpired since his last visit here with Dr Romeo Apple - referral made, as per chart notes - including referral to Dr Ophelia Charter, then to Dr Alvester Morin, Aldean Baker. States he has been trying to get through to their office since last week (reminded patient offices were closed through Thanksgiving) - please review regarding messages at [facility] office. Routing to Research officer, political party, Toniann Fail.

## 2021-09-18 NOTE — Telephone Encounter (Signed)
Called pt and lm for him to give me a call

## 2021-09-25 ENCOUNTER — Other Ambulatory Visit: Payer: Self-pay

## 2021-09-25 ENCOUNTER — Other Ambulatory Visit (HOSPITAL_COMMUNITY)
Admission: RE | Admit: 2021-09-25 | Discharge: 2021-09-25 | Disposition: A | Discharge status: Home / Self Care | Payer: BC Managed Care – PPO | Source: Ambulatory Visit | Attending: Internal Medicine | Admitting: Internal Medicine

## 2021-09-25 ENCOUNTER — Other Ambulatory Visit: Payer: Self-pay | Admitting: Orthopedic Surgery

## 2021-09-25 DIAGNOSIS — E785 Hyperlipidemia, unspecified: Secondary | ICD-10-CM | POA: Insufficient documentation

## 2021-09-25 DIAGNOSIS — M5136 Other intervertebral disc degeneration, lumbar region: Secondary | ICD-10-CM

## 2021-09-25 DIAGNOSIS — Z129 Encounter for screening for malignant neoplasm, site unspecified: Secondary | ICD-10-CM | POA: Insufficient documentation

## 2021-09-25 LAB — HEPATIC FUNCTION PANEL
ALT: 24 U/L (ref 0–44)
AST: 19 U/L (ref 15–41)
Albumin: 4.1 g/dL (ref 3.5–5.0)
Alkaline Phosphatase: 87 U/L (ref 38–126)
Bilirubin, Direct: 0.1 mg/dL (ref 0.0–0.2)
Indirect Bilirubin: 0.5 mg/dL (ref 0.3–0.9)
Total Bilirubin: 0.6 mg/dL (ref 0.3–1.2)
Total Protein: 7.5 g/dL (ref 6.5–8.1)

## 2021-09-25 LAB — BASIC METABOLIC PANEL
Anion gap: 7 (ref 5–15)
BUN: 13 mg/dL (ref 8–23)
CO2: 27 mmol/L (ref 22–32)
Calcium: 9.2 mg/dL (ref 8.9–10.3)
Chloride: 105 mmol/L (ref 98–111)
Creatinine, Ser: 0.89 mg/dL (ref 0.61–1.24)
GFR, Estimated: >60 mL/min (ref >60)
Glucose, Bld: 160 mg/dL — ABNORMAL HIGH (ref 70–99)
Potassium: 3.8 mmol/L (ref 3.5–5.1)
Sodium: 139 mmol/L (ref 135–145)

## 2021-09-25 LAB — CBC WITH DIFFERENTIAL/PLATELET
Abs Immature Granulocytes: 0.01 10*3/uL (ref 0.00–0.07)
Basophils Absolute: 0 10*3/uL (ref 0.0–0.1)
Basophils Relative: 1 %
Eosinophils Absolute: 0.1 10*3/uL (ref 0.0–0.5)
Eosinophils Relative: 2 %
HCT: 42 % (ref 39.0–52.0)
Hemoglobin: 13.5 g/dL (ref 13.0–17.0)
Immature Granulocytes: 0 %
Lymphocytes Relative: 40 %
Lymphs Abs: 1.7 10*3/uL (ref 0.7–4.0)
MCH: 28.5 pg (ref 26.0–34.0)
MCHC: 32.1 g/dL (ref 30.0–36.0)
MCV: 88.6 fL (ref 80.0–100.0)
Monocytes Absolute: 0.5 10*3/uL (ref 0.1–1.0)
Monocytes Relative: 11 %
Neutro Abs: 1.9 10*3/uL (ref 1.7–7.7)
Neutrophils Relative %: 46 %
Platelets: 173 10*3/uL (ref 150–400)
RBC: 4.74 MIL/uL (ref 4.22–5.81)
RDW: 15.6 % — ABNORMAL HIGH (ref 11.5–15.5)
WBC: 4.1 10*3/uL (ref 4.0–10.5)
nRBC: 0 % (ref 0.0–0.2)

## 2021-09-25 LAB — LIPID PANEL
Cholesterol: 149 mg/dL (ref 0–200)
HDL: 42 mg/dL (ref >40)
LDL Cholesterol: 97 mg/dL (ref 0–99)
Total CHOL/HDL Ratio: 3.5 RATIO
Triglycerides: 49 mg/dL (ref <150)
VLDL: 10 mg/dL (ref 0–40)

## 2021-10-02 ENCOUNTER — Other Ambulatory Visit: Payer: Self-pay

## 2021-10-02 ENCOUNTER — Ambulatory Visit
Admission: EM | Admit: 2021-10-02 | Discharge: 2021-10-02 | Discharge status: Absent without leave | Payer: BC Managed Care – PPO

## 2021-10-23 ENCOUNTER — Other Ambulatory Visit: Payer: Self-pay | Admitting: Orthopedic Surgery

## 2021-10-23 ENCOUNTER — Telehealth: Payer: Self-pay | Admitting: Orthopedic Surgery

## 2021-10-23 DIAGNOSIS — M5136 Other intervertebral disc degeneration, lumbar region: Secondary | ICD-10-CM

## 2021-10-23 NOTE — Telephone Encounter (Signed)
Patient called to relay he is scheduled for referral appointment at [facility] on 12/03/20, and asked for his Xrays. I relayed the contact information for [facility] [facility] [phone], which he said he will contact to request.

## 2021-10-30 ENCOUNTER — Other Ambulatory Visit: Payer: Self-pay

## 2021-10-30 ENCOUNTER — Ambulatory Visit: Payer: BC Managed Care – PPO | Admitting: Orthopaedic Surgery

## 2021-10-31 ENCOUNTER — Encounter: Payer: Self-pay | Admitting: Physical Medicine and Rehabilitation

## 2021-11-06 ENCOUNTER — Ambulatory Visit (INDEPENDENT_AMBULATORY_CARE_PROVIDER_SITE_OTHER): Payer: BC Managed Care – PPO | Admitting: Orthopaedic Surgery

## 2021-11-06 ENCOUNTER — Other Ambulatory Visit: Payer: Self-pay

## 2021-11-06 ENCOUNTER — Encounter: Payer: Self-pay | Admitting: Orthopaedic Surgery

## 2021-11-06 VITALS — Ht 68.5 in | Wt 239.0 lb

## 2021-11-06 DIAGNOSIS — M5126 Other intervertebral disc displacement, lumbar region: Secondary | ICD-10-CM

## 2021-11-06 DIAGNOSIS — R29898 Other symptoms and signs involving the musculoskeletal system: Secondary | ICD-10-CM

## 2021-11-06 DIAGNOSIS — M47812 Spondylosis without myelopathy or radiculopathy, cervical region: Secondary | ICD-10-CM | POA: Diagnosis not present

## 2021-11-06 DIAGNOSIS — M4726 Other spondylosis with radiculopathy, lumbar region: Secondary | ICD-10-CM | POA: Diagnosis not present

## 2021-11-06 NOTE — Progress Notes (Signed)
Office Visit Note   Patient: Angel Powell           Date of Birth: 1956-01-25           MRN: [id number] Visit Date: 11/06/2021              Requested by: Benetta Spar, MD [address] [place],  [place] [address] PCP: Benetta Spar, MD   Assessment & Plan: Visit Diagnoses:  1. Bilateral arm weakness   2. Spondylosis without myelopathy or radiculopathy, cervical region   3. Protrusion of lumbar intervertebral disc   4. Other spondylosis with radiculopathy, lumbar region     Plan: With his complaints of bilateral arm and hand numbness and weakness with previous CT scan cervical spine showing multilevel spondylosis we will proceed with cervical MRI scan.  He is to has complained of lower extremity weakness more pain in the left leg than right.  Office follow-up after cervical MRI scan.  Follow-Up Instructions: No follow-ups on file.   Orders:  Orders Placed This Encounter  Procedures   MR Cervical Spine w/o contrast   No orders of the defined types were placed in this encounter.     Procedures: No procedures performed   Clinical Data: No additional findings.   Subjective: Chief Complaint  Patient presents with   Lower Back - Pain, Follow-up    HPI 66 year old male not seen by me since 07/31/2021 is here with constant left leg pain.  He states he has bilateral arm and hand numbness pain in his neck soreness in his knees difficulty getting up and down.  Multiple phone calls back-and-forth in the office after he had called stating the epidural did not give him relief.  Documented phone calls they were unable to reach him.  Patient's used Tylenol he tried his wife's muscle relaxant and thought that might of helped some.  He has noticed weakness in his arms.  Previous CT scan after a fall cervical spine showed multilevel spondylosis with foraminal stenosis C3-C7 without definite fracture.  Lumbar MRI ordered by Dr. Romeo Apple showed multilevel  spondylosis with moderate to severe foraminal encroachment present at L2-3, L3-4 less severe at L4-5 and moderate at L5-S1.  Fortunately the L1-2 level was only mild patient states he is having leg pain constant on the left pain in his thighs that radiates to his knees and feels like something has to be done.  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  Patient's had a steroid pack and states it really did not give him any relief.  Review of Systems positive for cervical spondylosis lumbar disc degeneration, GERD, hypertension.   Objective: Vital Signs: Ht 5' 8.5" (1.74 m)    Wt 239 lb (108.4 kg)    BMI 35.81 kg/m   Physical Exam Constitutional:      Appearance: He is well-developed.  HENT:     Head: Normocephalic and atraumatic.     Right Ear: External ear normal.     Left Ear: External ear normal.  Eyes:     Pupils: Pupils are equal, round, and reactive to light.  Neck:     Thyroid: No thyromegaly.     Trachea: No tracheal deviation.  Cardiovascular:     Rate and Rhythm: Normal rate.  Pulmonary:     Effort: Pulmonary effort is normal.     Breath sounds:  No wheezing.  Abdominal:     General: Bowel sounds are normal.     Palpations: Abdomen is soft.  Musculoskeletal:     Cervical back: Neck supple.  Skin:    General: Skin is warm and dry.     Capillary Refill: Capillary refill takes less than 2 seconds.  Neurological:     Mental Status: He is alert and oriented to person, place, and time.  Psychiatric:        Behavior: Behavior normal.        Thought Content: Thought content normal.        Judgment: Judgment normal.    Ortho Exam negative shoulder impingement he has some brachial plexus tenderness bilaterally.  Midthoracic pain with cervical flexion chin to chest.  Neck pain with extension.  Some discomfort with Spurling right and left upper extremity reflexes are 2-3+ and  symmetrical and ankle jerk are 3+ and symmetrical no clonus.  No atrophy no isolated motor weakness upper or lower extremities.  Specialty Comments:  No specialty comments available.  Imaging: No results found.   PMFS History: Patient Active Problem List   Diagnosis Date Noted   Spondylosis without myelopathy or radiculopathy, cervical region 11/06/2021   Other spondylosis with radiculopathy, lumbar region 07/31/2021   Protrusion of lumbar intervertebral disc 07/31/2021   Elevated LFTs 11/15/2019   Colon cancer screening 11/15/2019   GERD (gastroesophageal reflux disease) 11/15/2019   RUPTURE ROTATOR CUFF 01/07/2009   DEGENERATIVE JOINT DISEASE, RIGHT SHOULDER 12/05/2007   SHOULDER PAIN 12/05/2007   Past Medical History:  Diagnosis Date   GERD (gastroesophageal reflux disease)    Hypercholesteremia     Family History  Problem Relation Age of Onset   Colon cancer Neg Hx     Past Surgical History:  Procedure Laterality Date   bone spur     right   COLONOSCOPY  02/10/2007   Small internal hemorrhoids.  Otherwise normal colon.  Repeat in 10 years.   COLONOSCOPY WITH PROPOFOL N/A 07/25/2020   Procedure: COLONOSCOPY WITH PROPOFOL;  Surgeon: Corbin Ade, MD;  Location: [facility];  Service: Endoscopy;  Laterality: N/A;  7:30am   ROTATOR CUFF REPAIR     left, [facility]; [place]   Social History   Occupational History   Not on file  Tobacco Use   Smoking status: Former    Packs/day: 1.00    Years: 0.50    Pack years: 0.50    Types: Cigarettes   Smokeless tobacco: Never  Substance and Sexual Activity   Alcohol use: Not Currently    Comment: 2-3 beer every other day and a shot of liquor every other day; 05/13/20 "I quit"- last drank in June 2021   Drug use: No   Sexual activity: Not on file

## 2021-11-12 ENCOUNTER — Other Ambulatory Visit: Payer: Self-pay | Admitting: Orthopedic Surgery

## 2021-11-12 DIAGNOSIS — M5136 Other intervertebral disc degeneration, lumbar region: Secondary | ICD-10-CM

## 2021-11-14 ENCOUNTER — Other Ambulatory Visit: Payer: Self-pay

## 2021-11-14 ENCOUNTER — Ambulatory Visit (HOSPITAL_COMMUNITY)
Admission: RE | Admit: 2021-11-14 | Discharge: 2021-11-14 | Disposition: A | Discharge status: Home / Self Care | Payer: BC Managed Care – PPO | Source: Ambulatory Visit | Attending: Orthopaedic Surgery | Admitting: Orthopaedic Surgery

## 2021-11-14 DIAGNOSIS — R29898 Other symptoms and signs involving the musculoskeletal system: Secondary | ICD-10-CM | POA: Diagnosis present

## 2021-12-24 ENCOUNTER — Other Ambulatory Visit: Payer: Self-pay | Admitting: Orthopedic Surgery

## 2021-12-24 DIAGNOSIS — G8929 Other chronic pain: Secondary | ICD-10-CM

## 2021-12-24 DIAGNOSIS — S46011D Strain of muscle(s) and tendon(s) of the rotator cuff of right shoulder, subsequent encounter: Secondary | ICD-10-CM

## 2022-01-13 ENCOUNTER — Other Ambulatory Visit (HOSPITAL_COMMUNITY): Payer: Self-pay | Admitting: Pain Medicine

## 2022-01-13 ENCOUNTER — Other Ambulatory Visit: Payer: Self-pay | Admitting: Pain Medicine

## 2022-01-13 DIAGNOSIS — M545 Low back pain, unspecified: Secondary | ICD-10-CM

## 2022-02-06 ENCOUNTER — Ambulatory Visit (HOSPITAL_COMMUNITY)
Admission: RE | Admit: 2022-02-06 | Discharge: 2022-02-06 | Disposition: A | Discharge status: Home / Self Care | Payer: BC Managed Care – PPO | Source: Ambulatory Visit | Attending: Pain Medicine | Admitting: Pain Medicine

## 2022-02-06 ENCOUNTER — Other Ambulatory Visit: Payer: Self-pay

## 2022-02-06 ENCOUNTER — Emergency Department (HOSPITAL_COMMUNITY)
Admission: EM | Admit: 2022-02-06 | Discharge: 2022-02-06 | Disposition: A | Discharge status: Home / Self Care | Payer: BC Managed Care – PPO | Attending: Emergency Medicine | Admitting: Emergency Medicine

## 2022-02-06 ENCOUNTER — Encounter (HOSPITAL_COMMUNITY): Payer: Self-pay | Admitting: *Deleted

## 2022-02-06 DIAGNOSIS — R42 Dizziness and giddiness: Secondary | ICD-10-CM | POA: Diagnosis present

## 2022-02-06 DIAGNOSIS — Z7984 Long term (current) use of oral hypoglycemic drugs: Secondary | ICD-10-CM | POA: Insufficient documentation

## 2022-02-06 DIAGNOSIS — M545 Low back pain, unspecified: Secondary | ICD-10-CM

## 2022-02-06 DIAGNOSIS — E119 Type 2 diabetes mellitus without complications: Secondary | ICD-10-CM | POA: Diagnosis not present

## 2022-02-06 LAB — BASIC METABOLIC PANEL
Anion gap: 9 (ref 5–15)
BUN: 20 mg/dL (ref 8–23)
CO2: 25 mmol/L (ref 22–32)
Calcium: 9.2 mg/dL (ref 8.9–10.3)
Chloride: 99 mmol/L (ref 98–111)
Creatinine, Ser: 0.91 mg/dL (ref 0.61–1.24)
GFR, Estimated: >60 mL/min (ref >60)
Glucose, Bld: 366 mg/dL — ABNORMAL HIGH (ref 70–99)
Potassium: 3.8 mmol/L (ref 3.5–5.1)
Sodium: 133 mmol/L — ABNORMAL LOW (ref 135–145)

## 2022-02-06 LAB — CBC
HCT: 39.3 % (ref 39.0–52.0)
Hemoglobin: 12.7 g/dL — ABNORMAL LOW (ref 13.0–17.0)
MCH: 26.8 pg (ref 26.0–34.0)
MCHC: 32.3 g/dL (ref 30.0–36.0)
MCV: 83.1 fL (ref 80.0–100.0)
Platelets: 140 10*3/uL — ABNORMAL LOW (ref 150–400)
RBC: 4.73 MIL/uL (ref 4.22–5.81)
RDW: 14.4 % (ref 11.5–15.5)
WBC: 4.4 10*3/uL (ref 4.0–10.5)
nRBC: 0 % (ref 0.0–0.2)

## 2022-02-06 LAB — URINALYSIS, ROUTINE W REFLEX MICROSCOPIC
Bacteria, UA: NONE SEEN
Bilirubin Urine: NEGATIVE
Glucose, UA: >=500 mg/dL — AB
Hgb urine dipstick: NEGATIVE
Ketones, ur: NEGATIVE mg/dL
Nitrite: NEGATIVE
Protein, ur: NEGATIVE mg/dL
Specific Gravity, Urine: 1.023 (ref 1.005–1.030)
pH: 6 (ref 5.0–8.0)

## 2022-02-06 LAB — HEMOGLOBIN A1C
Hgb A1c MFr Bld: 10.5 % — ABNORMAL HIGH (ref 4.8–5.6)
Mean Plasma Glucose: 254.65 mg/dL

## 2022-02-06 LAB — CBG MONITORING, ED: Glucose-Capillary: 374 mg/dL — ABNORMAL HIGH (ref 70–99)

## 2022-02-06 MED ORDER — SODIUM CHLORIDE 0.9 % IV BOLUS
1000.0000 mL | Freq: Once | INTRAVENOUS | Status: AC
  Administered 2022-02-06: 1000 mL via INTRAVENOUS

## 2022-02-06 MED ORDER — METFORMIN HCL 500 MG PO TABS: 500.0000 mg | ORAL_TABLET | Freq: Two times a day (BID) | ORAL | 2 refills | Status: AC

## 2022-02-06 NOTE — ED Triage Notes (Signed)
Pt in c/o lightheadedness x 1 wk, states, "My friend has taken my blood sugar and it was 412." Pt denies pain, A&O x4, reports polydipsia and polyuria ?

## 2022-02-06 NOTE — Discharge Instructions (Signed)
Your testing confirms that you do in fact have diabetes.  Thankfully it has been caught very early and you should respond to the medications.  I have started you on a medication called metformin which I want you to take twice a day, once in the morning and once at night.  You will need to start eating a diabetic diet and I have included some of this in your paperwork for you to read over ? ?You will need to follow-up with Dr. Felecia Shelling in 1 week for a recheck, make sure that you get your blood work done at the beginning of that visit and make sure you do not eat anything prior to going into the visit. ? ?Emergency department for severe or worsening symptoms ?

## 2022-02-06 NOTE — ED Provider Notes (Signed)
?[facility] ?Provider Note ? ? ?CSN: [id number] ?Arrival date & time: 02/06/22  1123 ? ?  ? ?History ? ?Chief Complaint  ?Patient presents with  ? Dizziness  ? ? ?Angel Powell is a 66 y.o. male. ? ? ?Dizziness ? ?This patient is a 66 year old male, no history of diabetes however he does have a history of hypercholesterolemia, the patient reports that over the last couple of weeks he has had some progressive lightheadedness, generalized weakness, dry mouth increasing urinary frequency and thirst but no weight loss.  No chest pain no belly pain no rashes no swelling.  He told a friend about this who asked him to check his blood sugar which she promptly did and found it to be well over 300, this morning when he was fasting before he had anything to eat or drink he checked it and it was 415.  The patient follows up with Dr. Felecia Shelling locally.  He has never been diagnosed with diabetes ? ?Home Medications ?Prior to Admission medications   ?Medication Sig Start Date End Date Taking? Authorizing Provider  ?metFORMIN (GLUCOPHAGE) 500 MG tablet Take 1 tablet (500 mg total) by mouth 2 (two) times daily with a meal. 02/06/22 05/07/22 Yes Eber Hong, MD  ?acetaminophen (TYLENOL) 500 MG tablet Take 500-1,000 mg by mouth every 6 (six) hours as needed (back pain.). ?Patient not taking: Reported on 09/10/2021    [provider]  ?atorvastatin (LIPITOR) 20 MG tablet Take 20 mg by mouth daily.    [provider]  ?Biotin w/ Vitamins C & E (HAIR/SKIN/NAILS PO) Take 1 tablet by mouth daily. ?Patient not taking: Reported on 09/10/2021    [provider]  ?gabapentin (NEURONTIN) 100 MG capsule Take 1 capsule (100 mg total) by mouth 3 (three) times daily. ?Patient not taking: Reported on 09/10/2021 07/21/21   Vickki Hearing, MD  ?ibuprofen (ADVIL) 800 MG tablet Take 800 mg by mouth every 8 (eight) hours as needed. ?Patient not taking: Reported on 09/10/2021 03/19/21   [provider]   ?indomethacin (INDOCIN) 25 MG capsule TAKE 1 CAPSULE BY MOUTH 3 TIMES DAILY WITH MEALS. 12/24/21   Oliver Barre, MD  ?Menthol, Topical Analgesic, (BIOFREEZE EX) Apply 1 application topically 3 (three) times daily as needed (back pain.). ?Patient not taking: Reported on 09/10/2021    [provider]  ?Multiple Vitamins-Minerals (MULTIVITAMIN WITH MINERALS) tablet Take 1 tablet by mouth daily.    [provider]  ?naphazoline-glycerin (CLEAR EYES REDNESS) 0.012-0.2 % SOLN Place 1 drop into both eyes 4 (four) times daily as needed for eye irritation.    [provider]  ?omega-3 acid ethyl esters (LOVAZA) 1 g capsule Take 1 g by mouth daily.    [provider]  ?omeprazole (PRILOSEC) 40 MG capsule Take 40 mg by mouth daily.  ?Patient not taking: Reported on 09/10/2021 07/06/20   [provider]  ?predniSONE (STERAPRED UNI-PAK 48 TAB) 10 MG (48) TBPK tablet Take by mouth daily. 10 mg ds 12 days as directed ?Patient not taking: Reported on 09/10/2021 06/30/21   Vickki Hearing, MD  ?tamsulosin (FLOMAX) 0.4 MG CAPS capsule Take 0.4 mg by mouth daily. 10/13/21   [provider]  ?tiZANidine (ZANAFLEX) 4 MG tablet TAKE 1 TABLET BY MOUTH EVERY DAY 11/12/21   Vickki Hearing, MD  ?Vitamin D, Ergocalciferol, (DRISDOL) 1.25 MG (50000 UNIT) CAPS capsule Take by mouth. ?Patient not taking: Reported on 11/06/2021 12/01/20   [provider]  ?   ? ?  Allergies    ?Patient has no known allergies.   ? ?Review of Systems   ?Review of Systems  ?Neurological:  Positive for dizziness.  ?All other systems reviewed and are negative. ? ?Physical Exam ?Updated Vital Signs ?BP 115/79 (BP Location: Left Arm)   Pulse 79   Temp 97.8 ?F (36.6 ?C) (Oral)   Resp 20   SpO2 94%  ?Physical Exam ?Vitals and nursing note reviewed.  ?Constitutional:   ?   General: He is not in acute distress. ?   Appearance: He is well-developed.  ?HENT:  ?   Head: Normocephalic and atraumatic.  ?    Mouth/Throat:  ?   Pharynx: No oropharyngeal exudate.  ?Eyes:  ?   General: No scleral icterus.    ?   Right eye: No discharge.     ?   Left eye: No discharge.  ?   Conjunctiva/sclera: Conjunctivae normal.  ?   Pupils: Pupils are equal, round, and reactive to light.  ?Neck:  ?   Thyroid: No thyromegaly.  ?   Vascular: No JVD.  ?Cardiovascular:  ?   Rate and Rhythm: Normal rate and regular rhythm.  ?   Heart sounds: Normal heart sounds. No murmur heard. ?  No friction rub. No gallop.  ?Pulmonary:  ?   Effort: Pulmonary effort is normal. No respiratory distress.  ?   Breath sounds: Normal breath sounds. No wheezing or rales.  ?Abdominal:  ?   General: Bowel sounds are normal. There is no distension.  ?   Palpations: Abdomen is soft. There is no mass.  ?   Tenderness: There is no abdominal tenderness.  ?Musculoskeletal:     ?   General: No tenderness. Normal range of motion.  ?   Cervical back: Normal range of motion and neck supple.  ?Lymphadenopathy:  ?   Cervical: No cervical adenopathy.  ?Skin: ?   General: Skin is warm and dry.  ?   Findings: No erythema or rash.  ?Neurological:  ?   Mental Status: He is alert.  ?   Coordination: Coordination normal.  ?Psychiatric:     ?   Behavior: Behavior normal.  ? ? ?ED Results / Procedures / Treatments   ?Labs ?(all labs ordered are listed, but only abnormal results are displayed) ?Labs Reviewed  ?BASIC METABOLIC PANEL - Abnormal; Notable for the following components:  ?    Result Value  ? Sodium 133 (*)   ? Glucose, Bld 366 (*)   ? All other components within normal limits  ?CBC - Abnormal; Notable for the following components:  ? Hemoglobin 12.7 (*)   ? Platelets 140 (*)   ? All other components within normal limits  ?CBG MONITORING, ED - Abnormal; Notable for the following components:  ? Glucose-Capillary 374 (*)   ? All other components within normal limits  ?URINALYSIS, ROUTINE W REFLEX MICROSCOPIC  ?HEMOGLOBIN A1C  ? ? ?EKG ?None ? ?Radiology ?MR LUMBAR SPINE WO  CONTRAST ? ?Result Date: 02/06/2022 ?CLINICAL DATA:  Low back pain x2 months. EXAM: MRI LUMBAR SPINE WITHOUT CONTRAST TECHNIQUE: Multiplanar, multisequence MR imaging of the lumbar spine was performed. No intravenous contrast was administered. COMPARISON:  MRI lumbar spine 07/17/2021. FINDINGS: Segmentation:  Standard. Alignment: Mild dextrocurvature. Mild retrolisthesis of L1 on L2, similar. Vertebrae: Multilevel degenerative endplate signal changes. No specific evidence of acute fracture or discitis/osteomyelitis. Heterogeneous marrow without suspicious bone lesion. Conus medullaris and cauda equina: Conus extends to the L1-L2 level. Conus appears normal.  Paraspinal and other soft tissues: Unremarkable. Disc levels: T12-L1: Only imaged sagittally.  No significant stenosis. L1-L2: Mild disc bulging and endplate spurring with moderate left foraminal stenosis, similar. L2-L3: Mild disc bulging and endplate spurring. Left greater than right facet arthropathy. Resulting moderate left foraminal stenosis, similar. Moderate left subarticular recess stenosis is also similar. L3-L4: Broad disc bulge and bilateral facet arthropathy. Resulting mild to moderate bilateral foraminal stenosis, similar. Prominent circumferential epidural lipomatosis with mild canal stenosis, similar. L4-L5: Broad disc bulge and endplate spurring. Bilateral facet arthropathy. Resulting mild-to-moderate bilateral foraminal stenosis, similar. Epidural lipomatosis with mild canal stenosis, similar. L5-S1: Right eccentric disc bulging with right greater than left facet arthropathy. Resulting moderate right foraminal stenosis. Prominent dorsal epidural lipomatosis with mild canal stenosis, similar. 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 Electronically Signed   By: Feliberto Harts M.D.   On: 02/06/2022 10:45   ? ?Procedures ?Procedures  ? ? ?Medications Ordered in ED ?Medications  ?sodium  chloride 0.9 % bolus 1,000 mL (0 mLs Intravenous Stopped 02/06/22 1433)  ? ? ?ED Course/ Medical Decision Making/ A&P ?  ?                        ?Medical Decision Making ?Amount and/or Complexity of Data Reviewed ?[place]

## 2022-02-19 ENCOUNTER — Ambulatory Visit (HOSPITAL_COMMUNITY)
Admission: RE | Admit: 2022-02-19 | Discharge: 2022-02-19 | Disposition: A | Discharge status: Home / Self Care | Payer: BC Managed Care – PPO | Source: Ambulatory Visit | Attending: Gerontology | Admitting: Gerontology

## 2022-02-19 ENCOUNTER — Other Ambulatory Visit (HOSPITAL_COMMUNITY): Payer: Self-pay | Admitting: Gerontology

## 2022-02-19 DIAGNOSIS — M25561 Pain in right knee: Secondary | ICD-10-CM

## 2022-04-03 ENCOUNTER — Other Ambulatory Visit: Payer: Self-pay | Admitting: Orthopedic Surgery

## 2022-04-03 DIAGNOSIS — M5136 Other intervertebral disc degeneration, lumbar region: Secondary | ICD-10-CM

## 2022-05-06 ENCOUNTER — Other Ambulatory Visit (HOSPITAL_COMMUNITY)
Admission: RE | Admit: 2022-05-06 | Discharge: 2022-05-06 | Disposition: A | Discharge status: Home / Self Care | Payer: BC Managed Care – PPO | Source: Ambulatory Visit | Attending: Internal Medicine | Admitting: Internal Medicine

## 2022-05-06 DIAGNOSIS — Z125 Encounter for screening for malignant neoplasm of prostate: Secondary | ICD-10-CM | POA: Diagnosis not present

## 2022-05-06 DIAGNOSIS — E1169 Type 2 diabetes mellitus with other specified complication: Secondary | ICD-10-CM | POA: Diagnosis present

## 2022-05-06 DIAGNOSIS — Z1159 Encounter for screening for other viral diseases: Secondary | ICD-10-CM | POA: Insufficient documentation

## 2022-05-06 DIAGNOSIS — Z0001 Encounter for general adult medical examination with abnormal findings: Secondary | ICD-10-CM | POA: Diagnosis not present

## 2022-05-06 DIAGNOSIS — Z79899 Other long term (current) drug therapy: Secondary | ICD-10-CM | POA: Diagnosis not present

## 2022-05-06 LAB — PSA: Prostatic Specific Antigen: 2.26 ng/mL (ref 0.00–4.00)

## 2022-05-06 LAB — BASIC METABOLIC PANEL
Anion gap: 5 (ref 5–15)
BUN: 18 mg/dL (ref 8–23)
CO2: 26 mmol/L (ref 22–32)
Calcium: 8.9 mg/dL (ref 8.9–10.3)
Chloride: 109 mmol/L (ref 98–111)
Creatinine, Ser: 0.94 mg/dL (ref 0.61–1.24)
GFR, Estimated: >60 mL/min (ref >60)
Glucose, Bld: 100 mg/dL — ABNORMAL HIGH (ref 70–99)
Potassium: 3.5 mmol/L (ref 3.5–5.1)
Sodium: 140 mmol/L (ref 135–145)

## 2022-05-06 LAB — CBC WITH DIFFERENTIAL/PLATELET
Abs Immature Granulocytes: 0.01 10*3/uL (ref 0.00–0.07)
Basophils Absolute: 0 10*3/uL (ref 0.0–0.1)
Basophils Relative: 1 %
Eosinophils Absolute: 0.1 10*3/uL (ref 0.0–0.5)
Eosinophils Relative: 2 %
HCT: 36 % — ABNORMAL LOW (ref 39.0–52.0)
Hemoglobin: 11.8 g/dL — ABNORMAL LOW (ref 13.0–17.0)
Immature Granulocytes: 0 %
Lymphocytes Relative: 48 %
Lymphs Abs: 1.8 10*3/uL (ref 0.7–4.0)
MCH: 28.2 pg (ref 26.0–34.0)
MCHC: 32.8 g/dL (ref 30.0–36.0)
MCV: 85.9 fL (ref 80.0–100.0)
Monocytes Absolute: 0.4 10*3/uL (ref 0.1–1.0)
Monocytes Relative: 10 %
Neutro Abs: 1.5 10*3/uL — ABNORMAL LOW (ref 1.7–7.7)
Neutrophils Relative %: 39 %
Platelets: 167 10*3/uL (ref 150–400)
RBC: 4.19 MIL/uL — ABNORMAL LOW (ref 4.22–5.81)
RDW: 15.8 % — ABNORMAL HIGH (ref 11.5–15.5)
WBC: 3.8 10*3/uL — ABNORMAL LOW (ref 4.0–10.5)
nRBC: 0 % (ref 0.0–0.2)

## 2022-05-06 LAB — LIPID PANEL
Cholesterol: 145 mg/dL (ref 0–200)
HDL: 38 mg/dL — ABNORMAL LOW (ref >40)
LDL Cholesterol: 98 mg/dL (ref 0–99)
Total CHOL/HDL Ratio: 3.8 RATIO
Triglycerides: 47 mg/dL (ref <150)
VLDL: 9 mg/dL (ref 0–40)

## 2022-05-06 LAB — HEMOGLOBIN A1C
Hgb A1c MFr Bld: 7.1 % — ABNORMAL HIGH (ref 4.8–5.6)
Mean Plasma Glucose: 157.07 mg/dL

## 2022-05-06 LAB — HEPATIC FUNCTION PANEL
ALT: 18 U/L (ref 0–44)
AST: 17 U/L (ref 15–41)
Albumin: 3.8 g/dL (ref 3.5–5.0)
Alkaline Phosphatase: 61 U/L (ref 38–126)
Bilirubin, Direct: <0.1 mg/dL (ref 0.0–0.2)
Total Bilirubin: 0.6 mg/dL (ref 0.3–1.2)
Total Protein: 7.1 g/dL (ref 6.5–8.1)

## 2022-05-07 LAB — HCV INTERPRETATION

## 2022-05-07 LAB — HCV AB W REFLEX TO QUANT PCR: HCV Ab: NONREACTIVE

## 2022-05-08 LAB — MICROALBUMIN / CREATININE URINE RATIO
Creatinine, Urine: 138.6 mg/dL
Microalb Creat Ratio: <2 mg/g creat (ref 0–29)
Microalb, Ur: <3 ug/mL — ABNORMAL HIGH

## 2022-06-16 ENCOUNTER — Ambulatory Visit (HOSPITAL_COMMUNITY)
Admission: RE | Admit: 2022-06-16 | Discharge: 2022-06-16 | Disposition: A | Discharge status: Home / Self Care | Payer: BC Managed Care – PPO | Source: Ambulatory Visit | Attending: Nurse Practitioner | Admitting: Nurse Practitioner

## 2022-06-16 ENCOUNTER — Other Ambulatory Visit (HOSPITAL_COMMUNITY): Payer: Self-pay | Admitting: Nurse Practitioner

## 2022-06-16 DIAGNOSIS — M5116 Intervertebral disc disorders with radiculopathy, lumbar region: Secondary | ICD-10-CM | POA: Insufficient documentation

## 2022-08-02 ENCOUNTER — Other Ambulatory Visit: Payer: Self-pay | Admitting: Orthopedic Surgery

## 2022-08-02 DIAGNOSIS — M5136 Other intervertebral disc degeneration, lumbar region: Secondary | ICD-10-CM

## 2022-08-02 DIAGNOSIS — G8929 Other chronic pain: Secondary | ICD-10-CM

## 2022-08-02 DIAGNOSIS — S46011D Strain of muscle(s) and tendon(s) of the rotator cuff of right shoulder, subsequent encounter: Secondary | ICD-10-CM

## 2022-09-17 ENCOUNTER — Other Ambulatory Visit (HOSPITAL_COMMUNITY): Payer: Self-pay | Admitting: Preventative Medicine

## 2022-09-17 ENCOUNTER — Ambulatory Visit (HOSPITAL_COMMUNITY)
Admission: RE | Admit: 2022-09-17 | Discharge: 2022-09-17 | Disposition: A | Discharge status: Home / Self Care | Payer: BC Managed Care – PPO | Source: Ambulatory Visit | Attending: Preventative Medicine | Admitting: Preventative Medicine

## 2022-09-17 DIAGNOSIS — M7989 Other specified soft tissue disorders: Secondary | ICD-10-CM | POA: Diagnosis present

## 2022-09-17 DIAGNOSIS — M79662 Pain in left lower leg: Secondary | ICD-10-CM | POA: Diagnosis not present

## 2022-09-20 ENCOUNTER — Other Ambulatory Visit: Payer: Self-pay

## 2022-09-20 ENCOUNTER — Encounter (HOSPITAL_COMMUNITY): Payer: Self-pay

## 2022-09-20 ENCOUNTER — Emergency Department (HOSPITAL_COMMUNITY)
Admission: EM | Admit: 2022-09-20 | Discharge: 2022-09-20 | Disposition: A | Discharge status: Home / Self Care | Payer: BC Managed Care – PPO | Attending: Emergency Medicine | Admitting: Emergency Medicine

## 2022-09-20 DIAGNOSIS — Z7984 Long term (current) use of oral hypoglycemic drugs: Secondary | ICD-10-CM | POA: Diagnosis not present

## 2022-09-20 DIAGNOSIS — E119 Type 2 diabetes mellitus without complications: Secondary | ICD-10-CM | POA: Diagnosis not present

## 2022-09-20 DIAGNOSIS — M7989 Other specified soft tissue disorders: Secondary | ICD-10-CM | POA: Diagnosis present

## 2022-09-20 DIAGNOSIS — L03116 Cellulitis of left lower limb: Secondary | ICD-10-CM | POA: Diagnosis not present

## 2022-09-20 DIAGNOSIS — Z79899 Other long term (current) drug therapy: Secondary | ICD-10-CM | POA: Diagnosis not present

## 2022-09-20 LAB — COMPREHENSIVE METABOLIC PANEL
ALT: 17 U/L (ref 0–44)
AST: 18 U/L (ref 15–41)
Albumin: 3.9 g/dL (ref 3.5–5.0)
Alkaline Phosphatase: 78 U/L (ref 38–126)
Anion gap: 11 (ref 5–15)
BUN: 15 mg/dL (ref 8–23)
CO2: 23 mmol/L (ref 22–32)
Calcium: 9.1 mg/dL (ref 8.9–10.3)
Chloride: 102 mmol/L (ref 98–111)
Creatinine, Ser: 1.04 mg/dL (ref 0.61–1.24)
GFR, Estimated: >60 mL/min (ref >60)
Glucose, Bld: 139 mg/dL — ABNORMAL HIGH (ref 70–99)
Potassium: 3.6 mmol/L (ref 3.5–5.1)
Sodium: 136 mmol/L (ref 135–145)
Total Bilirubin: 0.5 mg/dL (ref 0.3–1.2)
Total Protein: 8 g/dL (ref 6.5–8.1)

## 2022-09-20 LAB — CBC WITH DIFFERENTIAL/PLATELET
Abs Immature Granulocytes: 0.03 10*3/uL (ref 0.00–0.07)
Basophils Absolute: 0 10*3/uL (ref 0.0–0.1)
Basophils Relative: 0 %
Eosinophils Absolute: 0.1 10*3/uL (ref 0.0–0.5)
Eosinophils Relative: 1 %
HCT: 37.3 % — ABNORMAL LOW (ref 39.0–52.0)
Hemoglobin: 12.1 g/dL — ABNORMAL LOW (ref 13.0–17.0)
Immature Granulocytes: 0 %
Lymphocytes Relative: 21 %
Lymphs Abs: 1.7 10*3/uL (ref 0.7–4.0)
MCH: 27.3 pg (ref 26.0–34.0)
MCHC: 32.4 g/dL (ref 30.0–36.0)
MCV: 84.2 fL (ref 80.0–100.0)
Monocytes Absolute: 0.8 10*3/uL (ref 0.1–1.0)
Monocytes Relative: 10 %
Neutro Abs: 5.5 10*3/uL (ref 1.7–7.7)
Neutrophils Relative %: 68 %
Platelets: 178 10*3/uL (ref 150–400)
RBC: 4.43 MIL/uL (ref 4.22–5.81)
RDW: 15 % (ref 11.5–15.5)
WBC: 8.1 10*3/uL (ref 4.0–10.5)
nRBC: 0 % (ref 0.0–0.2)

## 2022-09-20 LAB — CK: Total CK: 146 U/L (ref 49–397)

## 2022-09-20 MED ORDER — SODIUM CHLORIDE 0.9 % IV SOLN
2.0000 g | Freq: Once | INTRAVENOUS | Status: DC
  Filled 2022-09-20: qty 20

## 2022-09-20 MED ORDER — DEXTROSE 5 % IV SOLN
1500.0000 mg | Freq: Once | INTRAVENOUS | Status: AC
  Administered 2022-09-20: 1500 mg via INTRAVENOUS
  Filled 2022-09-20: qty 75

## 2022-09-20 NOTE — Discharge Instructions (Signed)
Return here tomorrow to be seen for recheck.

## 2022-09-20 NOTE — ED Notes (Signed)
Pt provided discharge instructions and prescription information. Pt was given the opportunity to ask questions and questions were answered.   

## 2022-09-20 NOTE — ED Triage Notes (Signed)
"  Left knee has been hurting and I have been wearing a brace on it. Left lower leg started hurting on Monday and now it is really swollen and red" per pt

## 2022-09-20 NOTE — Progress Notes (Signed)
Pharmacy Note: dalbavancin (DALVANCE) for Acute Bacterial Skin and Skin Structure Infection (ABSSSI) Patients to [facility] Discharge   Angel Powell is a 66 y.o. male who presented to [facility] on 09/20/2022 with an ABSSSI.  Inclusion criteria: L lower leg cellulitis that has worsened despite treatment with cephalexin and sulfamethoxazole-trimethoprim. Hemodynamically stable, HR 100s.   Indication: Cellulitis  Patient has at least one SIRS criteria present: HR > 90  Exclusion criteria: Patient was evaluated and negative for hardware involvement, hypotension / shock, elevated lactate > 2 without other explanation, gram-negative infection risk factors (bites, water exposure, infection after trauma, infection after skin graft, burns, severe immunocompromise), necrotizing fasciitis possible / confirmed, known or suspected osteomyelitis or septic arthritis, endocarditis, diabetic foot infection, ischemic ulcers, post-operative wound infection, perirectal infection, need for drainage in the OR, hand / facial infections, injection drug users with  fever, bacteremia, pregnancy or breastfeeding, allergy related to antibiotics like vancomycin, known liver disease ( T.Bili > 2x ULN or AST/ALT > 3x ULN).  Doristine Counter 09/20/2022, 1:28 PM Clinical Pharmacist

## 2022-09-20 NOTE — ED Provider Notes (Signed)
[facility] EMERGENCY DEPARTMENT Provider Note   CSN: [id number] Arrival date & time: 09/20/22  0736     History  Chief Complaint  Patient presents with   Leg Swelling    Angel Powell is a 66 y.o. male.  Pt complains of redness and swelling to his left lower leg.  Pt reports he was seen at urgent care and sent here for an ultrasound 2 days ago.  Pt is on keflex and Bactrim.  Pt reports increased redness and swelling.  Pt denies fever or chills.  Pt had a negative ultrasound   The history is provided by the patient. No language interpreter was used.       Home Medications Prior to Admission medications   Medication Sig Start Date End Date Taking? Authorizing Provider  acetaminophen (TYLENOL) 500 MG tablet Take 500-1,000 mg by mouth every 6 (six) hours as needed (back pain.). Patient not taking: Reported on 09/10/2021    [provider]  atorvastatin (LIPITOR) 20 MG tablet Take 20 mg by mouth daily.    [provider]  Biotin w/ Vitamins C & E (HAIR/SKIN/NAILS PO) Take 1 tablet by mouth daily. Patient not taking: Reported on 09/10/2021    [provider]  gabapentin (NEURONTIN) 100 MG capsule Take 1 capsule (100 mg total) by mouth 3 (three) times daily. Patient not taking: Reported on 09/10/2021 07/21/21   Vickki Hearing, MD  ibuprofen (ADVIL) 800 MG tablet Take 800 mg by mouth every 8 (eight) hours as needed. Patient not taking: Reported on 09/10/2021 03/19/21   [provider]  indomethacin (INDOCIN) 25 MG capsule TAKE 1 CAPSULE BY MOUTH 3 TIMES DAILY WITH MEALS. 12/24/21   Oliver Barre, MD  Menthol, Topical Analgesic, (BIOFREEZE EX) Apply 1 application topically 3 (three) times daily as needed (back pain.). Patient not taking: Reported on 09/10/2021    [provider]  metFORMIN (GLUCOPHAGE) 500 MG tablet Take 1 tablet (500 mg total) by mouth 2 (two) times daily with a meal. 02/06/22 05/07/22  Eber Hong, MD  Multiple  Vitamins-Minerals (MULTIVITAMIN WITH MINERALS) tablet Take 1 tablet by mouth daily.    [provider]  naphazoline-glycerin (CLEAR EYES REDNESS) 0.012-0.2 % SOLN Place 1 drop into both eyes 4 (four) times daily as needed for eye irritation.    [provider]  omega-3 acid ethyl esters (LOVAZA) 1 g capsule Take 1 g by mouth daily.    [provider]  omeprazole (PRILOSEC) 40 MG capsule Take 40 mg by mouth daily.  Patient not taking: Reported on 09/10/2021 07/06/20   [provider]  predniSONE (STERAPRED UNI-PAK 48 TAB) 10 MG (48) TBPK tablet Take by mouth daily. 10 mg ds 12 days as directed Patient not taking: Reported on 09/10/2021 06/30/21   Vickki Hearing, MD  tamsulosin (FLOMAX) 0.4 MG CAPS capsule Take 0.4 mg by mouth daily. 10/13/21   [provider]  tiZANidine (ZANAFLEX) 4 MG tablet TAKE 1 TABLET BY MOUTH EVERY DAY 08/03/22   Vickki Hearing, MD  Vitamin D, Ergocalciferol, (DRISDOL) 1.25 MG (50000 UNIT) CAPS capsule Take by mouth. Patient not taking: Reported on 11/06/2021 12/01/20   [provider]      Allergies    Patient has no known allergies.    Review of Systems   Review of Systems  All other systems reviewed and are negative.   Physical Exam Updated Vital Signs BP 129/82   Pulse 94   Temp 100.2 F (37.9  C) (Oral)   Resp 20   Ht 5\' 9"  (1.753 m)   Wt 117.9 kg   SpO2 97%   BMI 38.40 kg/m  Physical Exam Vitals and nursing note reviewed.  Constitutional:      Appearance: He is well-developed.  HENT:     Head: Normocephalic.  Cardiovascular:     Rate and Rhythm: Normal rate.  Pulmonary:     Effort: Pulmonary effort is normal.  Abdominal:     General: There is no distension.  Musculoskeletal:        General: Swelling and tenderness present.     Cervical back: Normal range of motion.     Comments: Swollen red left leg,  nv and ns intact   Skin:    General: Skin is warm.  Neurological:     General:  No focal deficit present.     Mental Status: He is alert and oriented to person, place, and time.  Psychiatric:        Mood and Affect: Mood normal.     ED Results / Procedures / Treatments   Labs (all labs ordered are listed, but only abnormal results are displayed) Labs Reviewed  CBC WITH DIFFERENTIAL/PLATELET - Abnormal; Notable for the following components:      Result Value   Hemoglobin 12.1 (*)    HCT 37.3 (*)    All other components within normal limits  COMPREHENSIVE METABOLIC PANEL - Abnormal; Notable for the following components:   Glucose, Bld 139 (*)    All other components within normal limits  CK    EKG None  Radiology No results found.  Procedures Procedures    Medications Ordered in ED Medications  dalbavancin (DALVANCE) 1,500 mg in dextrose 5 % 500 mL IVPB (1,500 mg Intravenous New Bag/Given 09/20/22 1501)    ED Course/ Medical Decision Making/ A&P                           Medical Decision Making Pt complains of redness and swelling to his left leg.   Amount and/or Complexity of Data Reviewed External Data Reviewed: notes.    Details: Ultrasound shows no evidence of dvt  Labs: ordered. Decision-making details documented in ED Course.    Details: Labs ordered reviewed and interpreted.  Pt has a normal wbc count    Risk Decision regarding hospitalization. Risk Details: Dr. 14/3/23 in to see and examine.   Pt given dalvance iv.  Pt advised he needs to recheck tomorrow.               Final Clinical Impression(s) / ED Diagnoses Final diagnoses:  Cellulitis of leg, left    Rx / DC Orders ED Discharge Orders          Ordered    Ambulatory referral to Infectious Disease       Comments: Cellulitis patient:  Received dalbavancin on 09/20/2022.   09/20/22 1328           An After Visit Summary was printed and given to the patient.    14/03/23, PA-C 09/20/22 1528    14/03/23, MD 09/21/22 [phone]

## 2022-09-23 ENCOUNTER — Other Ambulatory Visit (HOSPITAL_COMMUNITY)
Admission: RE | Admit: 2022-09-23 | Discharge: 2022-09-23 | Disposition: A | Discharge status: Home / Self Care | Payer: BC Managed Care – PPO | Source: Ambulatory Visit | Attending: Internal Medicine | Admitting: Internal Medicine

## 2022-09-23 DIAGNOSIS — E785 Hyperlipidemia, unspecified: Secondary | ICD-10-CM | POA: Diagnosis not present

## 2022-09-23 DIAGNOSIS — Z125 Encounter for screening for malignant neoplasm of prostate: Secondary | ICD-10-CM | POA: Insufficient documentation

## 2022-09-23 DIAGNOSIS — Z0001 Encounter for general adult medical examination with abnormal findings: Secondary | ICD-10-CM | POA: Diagnosis present

## 2022-09-23 DIAGNOSIS — E559 Vitamin D deficiency, unspecified: Secondary | ICD-10-CM | POA: Diagnosis not present

## 2022-09-23 DIAGNOSIS — E1169 Type 2 diabetes mellitus with other specified complication: Secondary | ICD-10-CM | POA: Diagnosis not present

## 2022-09-23 LAB — HEPATIC FUNCTION PANEL
ALT: 25 U/L (ref 0–44)
AST: 21 U/L (ref 15–41)
Albumin: 4 g/dL (ref 3.5–5.0)
Alkaline Phosphatase: 89 U/L (ref 38–126)
Bilirubin, Direct: 0.1 mg/dL (ref 0.0–0.2)
Indirect Bilirubin: 0.4 mg/dL (ref 0.3–0.9)
Total Bilirubin: 0.5 mg/dL (ref 0.3–1.2)
Total Protein: 9.1 g/dL — ABNORMAL HIGH (ref 6.5–8.1)

## 2022-09-23 LAB — BASIC METABOLIC PANEL
Anion gap: 11 (ref 5–15)
BUN: 17 mg/dL (ref 8–23)
CO2: 21 mmol/L — ABNORMAL LOW (ref 22–32)
Calcium: 9.3 mg/dL (ref 8.9–10.3)
Chloride: 101 mmol/L (ref 98–111)
Creatinine, Ser: 1.17 mg/dL (ref 0.61–1.24)
GFR, Estimated: >60 mL/min (ref >60)
Glucose, Bld: 116 mg/dL — ABNORMAL HIGH (ref 70–99)
Potassium: 4 mmol/L (ref 3.5–5.1)
Sodium: 133 mmol/L — ABNORMAL LOW (ref 135–145)

## 2022-09-23 LAB — CBC WITH DIFFERENTIAL/PLATELET
Abs Immature Granulocytes: 0.02 10*3/uL (ref 0.00–0.07)
Basophils Absolute: 0 10*3/uL (ref 0.0–0.1)
Basophils Relative: 0 %
Eosinophils Absolute: 0.2 10*3/uL (ref 0.0–0.5)
Eosinophils Relative: 2 %
HCT: 40.2 % (ref 39.0–52.0)
Hemoglobin: 13.1 g/dL (ref 13.0–17.0)
Immature Granulocytes: 0 %
Lymphocytes Relative: 32 %
Lymphs Abs: 2.2 10*3/uL (ref 0.7–4.0)
MCH: 27.4 pg (ref 26.0–34.0)
MCHC: 32.6 g/dL (ref 30.0–36.0)
MCV: 84.1 fL (ref 80.0–100.0)
Monocytes Absolute: 0.8 10*3/uL (ref 0.1–1.0)
Monocytes Relative: 11 %
Neutro Abs: 3.8 10*3/uL (ref 1.7–7.7)
Neutrophils Relative %: 55 %
Platelets: 221 10*3/uL (ref 150–400)
RBC: 4.78 MIL/uL (ref 4.22–5.81)
RDW: 14.6 % (ref 11.5–15.5)
WBC: 7 10*3/uL (ref 4.0–10.5)
nRBC: 0 % (ref 0.0–0.2)

## 2022-09-23 LAB — LIPID PANEL
Cholesterol: 169 mg/dL (ref 0–200)
HDL: 38 mg/dL — ABNORMAL LOW (ref >40)
LDL Cholesterol: 122 mg/dL — ABNORMAL HIGH (ref 0–99)
Total CHOL/HDL Ratio: 4.4 RATIO
Triglycerides: 46 mg/dL (ref <150)
VLDL: 9 mg/dL (ref 0–40)

## 2022-09-23 LAB — VITAMIN D 25 HYDROXY (VIT D DEFICIENCY, FRACTURES): Vit D, 25-Hydroxy: 42.6 ng/mL (ref 30–100)

## 2022-09-23 LAB — PSA: Prostatic Specific Antigen: 2.35 ng/mL (ref 0.00–4.00)

## 2022-09-24 LAB — MICROALBUMIN / CREATININE URINE RATIO
Creatinine, Urine: 206 mg/dL
Microalb Creat Ratio: 7 mg/g creat (ref 0–29)
Microalb, Ur: 14.4 ug/mL — ABNORMAL HIGH

## 2022-09-24 LAB — HEMOGLOBIN A1C
Hgb A1c MFr Bld: 7.2 % — ABNORMAL HIGH (ref 4.8–5.6)
Mean Plasma Glucose: 160 mg/dL

## 2022-09-30 ENCOUNTER — Ambulatory Visit (INDEPENDENT_AMBULATORY_CARE_PROVIDER_SITE_OTHER): Payer: BC Managed Care – PPO | Admitting: Infectious Diseases

## 2022-09-30 ENCOUNTER — Encounter: Payer: Self-pay | Admitting: Infectious Diseases

## 2022-09-30 ENCOUNTER — Other Ambulatory Visit: Payer: Self-pay

## 2022-09-30 VITALS — BP 138/84 | HR 94 | Temp 97.9°F | Ht 69.0 in | Wt 261.0 lb

## 2022-09-30 DIAGNOSIS — Z79899 Other long term (current) drug therapy: Secondary | ICD-10-CM

## 2022-09-30 DIAGNOSIS — E669 Obesity, unspecified: Secondary | ICD-10-CM

## 2022-09-30 DIAGNOSIS — E1165 Type 2 diabetes mellitus with hyperglycemia: Secondary | ICD-10-CM | POA: Diagnosis not present

## 2022-09-30 DIAGNOSIS — L03116 Cellulitis of left lower limb: Secondary | ICD-10-CM | POA: Diagnosis not present

## 2022-09-30 MED ORDER — LINEZOLID 600 MG PO TABS: 600.0000 mg | ORAL_TABLET | Freq: Two times a day (BID) | ORAL | 0 refills | Status: DC

## 2022-09-30 NOTE — Progress Notes (Unsigned)
Patient Active Problem List   Diagnosis Date Noted   Spondylosis without myelopathy or radiculopathy, cervical region 11/06/2021   Other spondylosis with radiculopathy, lumbar region 07/31/2021   Protrusion of lumbar intervertebral disc 07/31/2021   Elevated LFTs 11/15/2019   Colon cancer screening 11/15/2019   GERD (gastroesophageal reflux disease) 11/15/2019   RUPTURE ROTATOR CUFF 01/07/2009   DEGENERATIVE JOINT DISEASE, RIGHT SHOULDER 12/05/2007   SHOULDER PAIN 12/05/2007   Current Outpatient Medications on File Prior to Visit  Medication Sig Dispense Refill   acetaminophen (TYLENOL) 500 MG tablet Take 500-1,000 mg by mouth every 6 (six) hours as needed (back pain.).     atorvastatin (LIPITOR) 20 MG tablet Take 20 mg by mouth daily.     Biotin w/ Vitamins C & E (HAIR/SKIN/NAILS PO) Take 1 tablet by mouth daily.     ibuprofen (ADVIL) 800 MG tablet Take 800 mg by mouth every 8 (eight) hours as needed.     indomethacin (INDOCIN) 25 MG capsule TAKE 1 CAPSULE BY MOUTH 3 TIMES DAILY WITH MEALS. 60 capsule 2   Menthol, Topical Analgesic, (BIOFREEZE EX) Apply 1 application  topically 3 (three) times daily as needed (back pain.).     Multiple Vitamins-Minerals (MULTIVITAMIN WITH MINERALS) tablet Take 1 tablet by mouth daily.     naphazoline-glycerin (CLEAR EYES REDNESS) 0.012-0.2 % SOLN Place 1 drop into both eyes 4 (four) times daily as needed for eye irritation.     omega-3 acid ethyl esters (LOVAZA) 1 g capsule Take 1 g by mouth daily.     omeprazole (PRILOSEC) 40 MG capsule Take 40 mg by mouth daily.     tamsulosin (FLOMAX) 0.4 MG CAPS capsule Take 0.4 mg by mouth daily.     tiZANidine (ZANAFLEX) 4 MG tablet TAKE 1 TABLET BY MOUTH EVERY DAY 90 tablet 1   Vitamin D, Ergocalciferol, (DRISDOL) 1.25 MG (50000 UNIT) CAPS capsule Take by mouth.     metFORMIN (GLUCOPHAGE) 500 MG tablet Take 1 tablet (500 mg total) by mouth 2 (two) times daily with a meal. 60 tablet 2   No current  facility-administered medications on file prior to visit.    Subjective: 66 Y O Male with PMH as below including newly diagnosed DM who is here for fu after being seen in the ED 12/3 for left leg cellulitis and received 1 dose of dalbavancin. Reports starting to itch in his left leg 2 weeks ago that was followed by redness and swelling the following day. Seen at the [facility] 11/30 who gave him a course of bactrim and cephalexin which he took for 2 days and then was seen in the ED 12/3 due to persistent symptoms. 11/30 left leg negative for DVT. Received one dose of dalbavancin on 12/3.   09/30/22 Reports significant improvement in the redness and pain but still has swelling. Denies fevers, chills, sweats. Denies nausea, vomiting, abdominal pain and diarrhea. Denies chest pain cough and SOB. Denies GU symptoms or rashes. Reports first episode of cellulitis without antecedent trauma or insect bite. Former smoker , quit drinking, denies IVDU. Lives with wife and works in a [employer].   Review of Systems all systems reviewed with pertinent positives and negatives as listed above   Past Medical History:  Diagnosis Date   GERD (gastroesophageal reflux disease)    Hypercholesteremia    Past Surgical History:  Procedure Laterality Date   bone spur     right   COLONOSCOPY  02/10/2007   Small internal hemorrhoids.  Otherwise normal colon.  Repeat in 10 years.   COLONOSCOPY WITH PROPOFOL N/A 07/25/2020   Procedure: COLONOSCOPY WITH PROPOFOL;  Surgeon: Corbin Ade, MD;  Location: [facility];  Service: Endoscopy;  Laterality: N/A;  7:30am   ROTATOR CUFF REPAIR     left, [facility]; [place]    Social History   Tobacco Use   Smoking status: Former    Packs/day: 1.00    Years: 0.50    Total pack years: 0.50    Types: Cigarettes    Quit date: 02/17/2020    Years since quitting: 2.6   Smokeless tobacco: Never  Substance Use Topics   Alcohol use: Not Currently    Comment: 2-3 beer every  other day and a shot of liquor every other day; 05/13/20 "I quit"- last drank in June 2021   Drug use: No    Family History  Problem Relation Age of Onset   Colon cancer Neg Hx     No Known Allergies  Health Maintenance  Topic Date Due   COVID-19 Vaccine (1) Never done   DTaP/Tdap/Td (1 - Tdap) Never done   Zoster Vaccines- Shingrix (1 of 2) Never done   Pneumonia Vaccine 32+ Years old (1 - PCV) Never done   INFLUENZA VACCINE  Never done   COLONOSCOPY (Pts 45-25yrs Insurance coverage will need to be confirmed)  07/25/2030   Hepatitis C Screening  Completed   HPV VACCINES  Aged Out    Objective: BP 138/84   Pulse 94   Temp 97.9 F (36.6 C) (Oral)   Ht 5\' 9"  (1.753 m)   Wt 261 lb (118.4 kg)   BMI 38.54 kg/m    Physical Exam Constitutional:      Appearance: Normal appearance. Morbidly obese  HENT:     Head: Normocephalic and atraumatic.      Mouth: Mucous membranes are moist.  Eyes:    Conjunctiva/sclera: Conjunctivae normal.     Pupils: Pupils are equal, round, and bilaterally symmetrical   Cardiovascular:     Rate and Rhythm: Normal rate and regular rhythm.     Heart sounds:   Pulmonary:     Effort: Pulmonary effort is normal.     Breath sounds: Normal breath sounds.   Abdominal:     General: Non distended     Palpations: soft. Non tender   Musculoskeletal:        General: Normal range of motion.   Left leg - mildly swollen but no redness, warmth, tenderness, calves soft. ROM of left ankle and left knee is good with no signs of septic joint.  No open wounds.   Rt leg wnl   Skin:    General: Skin is warm and dry.     Comments:  Neurological:     General: grossly non focal     Mental Status: awake, alert and oriented to person, place, and time.   Psychiatric:        Mood and Affect: Mood normal.   Lab Results Lab Results  Component Value Date   WBC 7.0 09/23/2022   HGB 13.1 09/23/2022   HCT 40.2 09/23/2022   MCV 84.1 09/23/2022   PLT 221  09/23/2022    Lab Results  Component Value Date   CREATININE 1.17 09/23/2022   BUN 17 09/23/2022   NA 133 (L) 09/23/2022   K 4.0 09/23/2022   CL 101 09/23/2022   CO2 21 (L) 09/23/2022    Lab Results  Component Value Date  ALT 25 09/23/2022   AST 21 09/23/2022   ALKPHOS 89 09/23/2022   BILITOT 0.5 09/23/2022    Lab Results  Component Value Date   CHOL 169 09/23/2022   HDL 38 (L) 09/23/2022   LDLCALC 122 (H) 09/23/2022   TRIG 46 09/23/2022   CHOLHDL 4.4 09/23/2022   No results found for: "LABRPR", "RPRTITER" No results found for: "HIV1RNAQUANT", "HIV1RNAVL", "CD4TABS"   Microbiology Results for orders placed or performed in visit on 09/10/21  Microscopic Examination     Status: Abnormal   Collection Time: 09/10/21  9:48 AM   Urine  Result Value Ref Range Status   WBC, UA 6-10 (A) 0 - 5 /hpf Final   RBC, Urine None seen 0 - 2 /hpf Final   Epithelial Cells (non renal) 0-10 0 - 10 /hpf Final   Renal Epithel, UA None seen None seen /hpf Final   Mucus, UA Present Not Estab. Final   Bacteria, UA Many (A) None seen/Few Final   Imaging US Venous Img Lower Unilateral Left (DVT)  Result Date: 09/17/2022 CLINICAL DATA:  Left lower extremity pain. EXAM: LEFT  LOWER EXTREMITY VENOUS DOPPLER ULTRASOUND TECHNIQUE: Gray-scale sonography with graded compression, as well as color Doppler and duplex ultrasound were performed to evaluate the lower extremity deep venous systems from the level of the common femoral vein and including the common femoral, femoral, profunda femoral, popliteal and calf veins including the posterior tibial, peroneal and gastrocnemius veins when visible. The superficial great saphenous vein was also interrogated. Spectral Doppler was utilized to evaluate flow at rest and with distal augmentation maneuvers in the common femoral, femoral and popliteal veins. COMPARISON:  None Available. FINDINGS: Contralateral Common Femoral Vein: Respiratory phasicity is normal and  symmetric with the symptomatic side. No evidence of thrombus. Normal compressibility. Common Femoral Vein: No evidence of thrombus. Normal compressibility, respiratory phasicity and response to augmentation. Saphenofemoral Junction: No evidence of thrombus. Normal compressibility and flow on color Doppler imaging. Profunda Femoral Vein: No evidence of thrombus. Normal compressibility and flow on color Doppler imaging. Femoral Vein: No evidence of thrombus. Normal compressibility, respiratory phasicity and response to augmentation. Popliteal Vein: No evidence of thrombus. Normal compressibility, respiratory phasicity and response to augmentation. Calf Veins: No evidence of thrombus. Normal compressibility and flow on color Doppler imaging. Superficial Great Saphenous Vein: No evidence of thrombus. Normal compressibility. Other Findings:  None. IMPRESSION: Negative for deep venous thrombosis in left lower extremity. Electronically Signed   By: Richarda Overlie M.D.   On: 09/17/2022 14:33    Assesssment/Plan # Left leg cellulitis  # Medication management  S/p 2 days of Bactrim and cephalexin on 11/30 S/p one dose of dalbavacin on 12/3 Significant improvement in pain and redness Discussed to keep legs elevated  No labs as clinically improving  Linezolid 600mg  po bid * 7 days given mild tenderness to finish off tx Fu as needed or with worsening signs and symptoms   # DM/Obesity  Discussed BG control as well heathy life style and exercise.   I have personally spent 66 minutes involved in face-to-face and non-face-to-face activities for this patient on the day of the visit. Professional time spent includes the following activities: Preparing to see the patient (review of tests), Obtaining and/or reviewing separately obtained history (admission/discharge record), Performing a medically appropriate examination and/or evaluation , Ordering medications/tests/procedures, referring and communicating with other health  care professionals, Documenting clinical information in the EMR, Independently interpreting results (not separately reported), Communicating results to the patient/family/caregiver,  Counseling and educating the patient/family/caregiver and Care coordination (not separately reported).   Victoriano LainSabina Manadhar, MD [facility] 09/30/2022, 9:17 AM

## 2022-10-01 ENCOUNTER — Ambulatory Visit: Payer: BC Managed Care – PPO | Admitting: Podiatry

## 2022-10-01 DIAGNOSIS — E669 Obesity, unspecified: Secondary | ICD-10-CM | POA: Insufficient documentation

## 2022-10-01 DIAGNOSIS — E1165 Type 2 diabetes mellitus with hyperglycemia: Secondary | ICD-10-CM | POA: Insufficient documentation

## 2022-10-05 ENCOUNTER — Ambulatory Visit: Payer: BC Managed Care – PPO | Admitting: Podiatry

## 2022-12-17 ENCOUNTER — Encounter: Payer: Self-pay | Admitting: Radiology

## 2023-02-21 ENCOUNTER — Other Ambulatory Visit: Payer: Self-pay | Admitting: Orthopedic Surgery

## 2023-02-21 DIAGNOSIS — M5136 Other intervertebral disc degeneration, lumbar region: Secondary | ICD-10-CM

## 2023-04-07 ENCOUNTER — Other Ambulatory Visit (HOSPITAL_COMMUNITY)
Admission: RE | Admit: 2023-04-07 | Discharge: 2023-04-07 | Disposition: A | Discharge status: Home / Self Care | Payer: BC Managed Care – PPO | Source: Ambulatory Visit | Attending: Internal Medicine | Admitting: Internal Medicine

## 2023-04-07 DIAGNOSIS — E1169 Type 2 diabetes mellitus with other specified complication: Secondary | ICD-10-CM | POA: Diagnosis present

## 2023-04-07 LAB — HEMOGLOBIN A1C
Hgb A1c MFr Bld: 6.7 % — ABNORMAL HIGH (ref 4.8–5.6)
Mean Plasma Glucose: 145.59 mg/dL

## 2023-04-07 LAB — BASIC METABOLIC PANEL
Anion gap: 9 (ref 5–15)
BUN: 15 mg/dL (ref 8–23)
CO2: 25 mmol/L (ref 22–32)
Calcium: 8.9 mg/dL (ref 8.9–10.3)
Chloride: 101 mmol/L (ref 98–111)
Creatinine, Ser: 1.04 mg/dL (ref 0.61–1.24)
GFR, Estimated: >60 mL/min (ref >60)
Glucose, Bld: 130 mg/dL — ABNORMAL HIGH (ref 70–99)
Potassium: 3.7 mmol/L (ref 3.5–5.1)
Sodium: 135 mmol/L (ref 135–145)

## 2023-06-23 ENCOUNTER — Other Ambulatory Visit (HOSPITAL_COMMUNITY): Payer: Self-pay | Admitting: Gerontology

## 2023-06-23 DIAGNOSIS — R109 Unspecified abdominal pain: Secondary | ICD-10-CM

## 2023-06-23 DIAGNOSIS — R1905 Periumbilic swelling, mass or lump: Secondary | ICD-10-CM

## 2023-06-25 ENCOUNTER — Other Ambulatory Visit: Payer: Self-pay

## 2023-06-25 ENCOUNTER — Emergency Department (HOSPITAL_COMMUNITY): Payer: BC Managed Care – PPO

## 2023-06-25 ENCOUNTER — Encounter (HOSPITAL_COMMUNITY): Payer: Self-pay

## 2023-06-25 ENCOUNTER — Emergency Department (HOSPITAL_COMMUNITY)
Admission: EM | Admit: 2023-06-25 | Discharge: 2023-06-25 | Disposition: A | Discharge status: Home / Self Care | Payer: BC Managed Care – PPO | Attending: Emergency Medicine | Admitting: Emergency Medicine

## 2023-06-25 DIAGNOSIS — Z7984 Long term (current) use of oral hypoglycemic drugs: Secondary | ICD-10-CM | POA: Insufficient documentation

## 2023-06-25 DIAGNOSIS — E119 Type 2 diabetes mellitus without complications: Secondary | ICD-10-CM | POA: Insufficient documentation

## 2023-06-25 DIAGNOSIS — K420 Umbilical hernia with obstruction, without gangrene: Secondary | ICD-10-CM

## 2023-06-25 DIAGNOSIS — K429 Umbilical hernia without obstruction or gangrene: Secondary | ICD-10-CM | POA: Insufficient documentation

## 2023-06-25 LAB — COMPREHENSIVE METABOLIC PANEL
ALT: 32 U/L (ref 0–44)
AST: 20 U/L (ref 15–41)
Albumin: 3.8 g/dL (ref 3.5–5.0)
Alkaline Phosphatase: 71 U/L (ref 38–126)
Anion gap: 11 (ref 5–15)
BUN: 17 mg/dL (ref 8–23)
CO2: 21 mmol/L — ABNORMAL LOW (ref 22–32)
Calcium: 8.9 mg/dL (ref 8.9–10.3)
Chloride: 104 mmol/L (ref 98–111)
Creatinine, Ser: 1.01 mg/dL (ref 0.61–1.24)
GFR, Estimated: >60 mL/min (ref >60)
Glucose, Bld: 142 mg/dL — ABNORMAL HIGH (ref 70–99)
Potassium: 3.4 mmol/L — ABNORMAL LOW (ref 3.5–5.1)
Sodium: 136 mmol/L (ref 135–145)
Total Bilirubin: 0.7 mg/dL (ref 0.3–1.2)
Total Protein: 7.4 g/dL (ref 6.5–8.1)

## 2023-06-25 LAB — CBC WITH DIFFERENTIAL/PLATELET
Abs Immature Granulocytes: 0 10*3/uL (ref 0.00–0.07)
Basophils Absolute: 0 10*3/uL (ref 0.0–0.1)
Basophils Relative: 0 %
Eosinophils Absolute: 0.1 10*3/uL (ref 0.0–0.5)
Eosinophils Relative: 2 %
HCT: 39 % (ref 39.0–52.0)
Hemoglobin: 12.5 g/dL — ABNORMAL LOW (ref 13.0–17.0)
Immature Granulocytes: 0 %
Lymphocytes Relative: 39 %
Lymphs Abs: 1.8 10*3/uL (ref 0.7–4.0)
MCH: 28.1 pg (ref 26.0–34.0)
MCHC: 32.1 g/dL (ref 30.0–36.0)
MCV: 87.6 fL (ref 80.0–100.0)
Monocytes Absolute: 0.4 10*3/uL (ref 0.1–1.0)
Monocytes Relative: 9 %
Neutro Abs: 2.3 10*3/uL (ref 1.7–7.7)
Neutrophils Relative %: 50 %
Platelets: 148 10*3/uL — ABNORMAL LOW (ref 150–400)
RBC: 4.45 MIL/uL (ref 4.22–5.81)
RDW: 15.8 % — ABNORMAL HIGH (ref 11.5–15.5)
WBC: 4.7 10*3/uL (ref 4.0–10.5)
nRBC: 0 % (ref 0.0–0.2)

## 2023-06-25 LAB — LACTIC ACID, PLASMA: Lactic Acid, Venous: 3 mmol/L (ref 0.5–1.9)

## 2023-06-25 MED ORDER — IOHEXOL 300 MG/ML  SOLN
100.0000 mL | Freq: Once | INTRAMUSCULAR | Status: AC | PRN
  Administered 2023-06-25: 100 mL via INTRAVENOUS

## 2023-06-25 MED ORDER — HYDROCODONE-ACETAMINOPHEN 5-325 MG PO TABS: 1.0000 | ORAL_TABLET | Freq: Four times a day (QID) | ORAL | 0 refills | Status: DC | PRN

## 2023-06-25 MED ORDER — MORPHINE SULFATE (PF) 4 MG/ML IV SOLN
4.0000 mg | Freq: Once | INTRAVENOUS | Status: AC
  Administered 2023-06-25: 4 mg via INTRAVENOUS
  Filled 2023-06-25: qty 1

## 2023-06-25 NOTE — ED Triage Notes (Signed)
Pt has a swelling around his umbilicus x 3 days and has no hx of hernia.

## 2023-06-25 NOTE — ED Provider Notes (Signed)
[facility] EMERGENCY DEPARTMENT AT [facility] Provider Note   CSN: [id number] Arrival date & time: 06/25/23  1103     History  Chief Complaint  Patient presents with   Hernia    Angel Powell is a 67 y.o. male.  He has PMH of elevated LFTs, GERD, obesity, type 2 diabetes.  Presents ER today for bulging area his umbilicus.  This started 3 days ago, started spontaneously, states it felt a little uncomfortable, he went to his PCP who ordered an ultrasound.  He was unable to the ultrasound and had to be rescheduled for next week.  Today it was red and more painful so he went to urgent care who referred him to the ER for further evaluation.  He denies fevers or chills, no nausea or vomiting.  There is very tender, not able to be reduced, he has never had hernia or similar symptoms in the past.  HPI     Home Medications Prior to Admission medications   Medication Sig Start Date End Date Taking? Authorizing Provider  acetaminophen (TYLENOL) 500 MG tablet Take 500-1,000 mg by mouth every 6 (six) hours as needed (back pain.).    [provider]  atorvastatin (LIPITOR) 20 MG tablet Take 20 mg by mouth daily.    [provider]  Biotin w/ Vitamins C & E (HAIR/SKIN/NAILS PO) Take 1 tablet by mouth daily.    [provider]  ibuprofen (ADVIL) 800 MG tablet Take 800 mg by mouth every 8 (eight) hours as needed. 03/19/21   [provider]  indomethacin (INDOCIN) 25 MG capsule TAKE 1 CAPSULE BY MOUTH 3 TIMES DAILY WITH MEALS. 12/24/21   Oliver Barre, MD  linezolid (ZYVOX) 600 MG tablet Take 1 tablet (600 mg total) by mouth 2 (two) times daily. 09/30/22   Odette Fraction, MD  Menthol, Topical Analgesic, (BIOFREEZE EX) Apply 1 application  topically 3 (three) times daily as needed (back pain.).    [provider]  metFORMIN (GLUCOPHAGE) 500 MG tablet Take 1 tablet (500 mg total) by mouth 2 (two) times daily with a meal. 02/06/22 05/07/22  Eber Hong, MD  Multiple Vitamins-Minerals (MULTIVITAMIN WITH MINERALS) tablet Take 1 tablet by mouth daily.    [provider]  naphazoline-glycerin (CLEAR EYES REDNESS) 0.012-0.2 % SOLN Place 1 drop into both eyes 4 (four) times daily as needed for eye irritation.    [provider]  omega-3 acid ethyl esters (LOVAZA) 1 g capsule Take 1 g by mouth daily.    [provider]  omeprazole (PRILOSEC) 40 MG capsule Take 40 mg by mouth daily. 07/06/20   [provider]  tamsulosin (FLOMAX) 0.4 MG CAPS capsule Take 0.4 mg by mouth daily. 10/13/21   [provider]  tiZANidine (ZANAFLEX) 4 MG tablet TAKE 1 TABLET BY MOUTH EVERY DAY 02/22/23   Vickki Hearing, MD  Vitamin D, Ergocalciferol, (DRISDOL) 1.25 MG (50000 UNIT) CAPS capsule Take by mouth. 12/01/20   [provider]      Allergies    Patient has no known allergies.    Review of Systems   Review of Systems  Physical Exam Updated Vital Signs BP 133/89 (BP Location: Right Arm)   Pulse 96   Temp 97.9 F (36.6 C) (Oral)   Resp 16   Ht 5\' 9"  (1.753 m)   Wt 118.4 kg   SpO2 96%   BMI 38.54 kg/m  Physical Exam Vitals and nursing note reviewed.  Constitutional:  General: He is not in acute distress.    Appearance: He is well-developed.  HENT:     Head: Normocephalic and atraumatic.     Mouth/Throat:     Mouth: Mucous membranes are moist.  Eyes:     Extraocular Movements: Extraocular movements intact.     Conjunctiva/sclera: Conjunctivae normal.     Pupils: Pupils are equal, round, and reactive to light.  Cardiovascular:     Rate and Rhythm: Normal rate and regular rhythm.     Heart sounds: No murmur heard. Pulmonary:     Effort: Pulmonary effort is normal. No respiratory distress.     Breath sounds: Normal breath sounds.  Abdominal:     Palpations: Abdomen is soft.     Tenderness: There is no abdominal tenderness.  Musculoskeletal:        General: No swelling.      Cervical back: Neck supple.  Skin:    General: Skin is warm and dry.     Capillary Refill: Capillary refill takes less than 2 seconds.  Neurological:     General: No focal deficit present.     Mental Status: He is alert and oriented to person, place, and time.  Psychiatric:        Mood and Affect: Mood normal.     ED Results / Procedures / Treatments   Labs (all labs ordered are listed, but only abnormal results are displayed) Labs Reviewed  COMPREHENSIVE METABOLIC PANEL  CBC WITH DIFFERENTIAL/PLATELET    EKG None  Radiology No results found.  Procedures Procedures    Medications Ordered in ED Medications - No data to display  ED Course/ Medical Decision Making/ A&P Clinical Course as of 06/25/23 1757  Fri Jun 25, 2023  1646 CT shows fat-containing hernia with surrounding stranding concerning for strangulation/ischemia.  This fits his clinical picture.  He is feeling much better after morphine, sitting in chair and laughing.  General surgery is consulted at this time. [CB]    Clinical Course User Index [CB] Ma Rings, PA-C                                 Medical Decision Making This patient presents to the ED for concern of umbilical abdominal pain with bulging and redness, this involves an extensive number of treatment options, and is a complaint that carries with it a high risk of complications and morbidity.  The differential diagnosis includes incarcerated hernia, obstruction, cellulitis, abscess, other   Co morbidities that complicate the patient evaluation :   Diabetes   Additional history obtained:  Additional history obtained from EMR, [place] occupational and general medicine referral form sent with patient External records from outside source obtained and reviewed including notes   Lab Tests:  I Ordered, and personally interpreted labs.  The pertinent results include: CBC shows Mild anemia at baseline, CMP shows CO2 slightly present 21, no  increased anion gap, lactic acid elevated at 3 likely due to strangulated fat   Imaging Studies ordered:  I ordered imaging studies including CT abdomen pelvis which shows fat-containing umbilical hernia with surrounding fat stranding during I independently visualized and interpreted imaging within scope of identifying emergent findings  I agree with the radiologist interpretation   Cardiac Monitoring: / EKG:  The patient was maintained on a cardiac monitor.  I personally viewed and interpreted the cardiac monitored which showed an underlying rhythm of: Sinus rhythm   Consultations  Obtained:  I requested consultation with the general surgeon Dr. Robyne Peers,  and discussed lab and imaging findings as well as pertinent plan - they recommend: Apply ice, give pain meds and attempt to reduce.  If not successful since this is only fat-containing and patient is tolerating p.o., no skin breakdown he can discharge and follow-up in the outpatient office.   Problem List / ED Course / Critical interventions / Medication management  Here with history and exam findings of incarcerated hernia CT shows this is only fat-containing he had some hyperemia of the skin.  Lactate elevated likely due to strangulated fat.  I applied ice and patient was given morphine, attempted reduction.  Patient refused further attempts, did not want additional dose of pain medicine, states he feels like it is gone down somewhat with the ice will continue icing at home.  Discussed outpatient follow-up and strict return precautions. I ordered medication including morphine for pain  Reevaluation of the patient after these medicines showed that the patient improved I have reviewed the patients home medicines and have made adjustments as needed   Staffed with Dr. Estell Harpin as well]  Patient called back stating [facility] does not have the narcotics in stock so prescription was sent to [facility]    Amount and/or Complexity of Data Reviewed Labs: ordered. Radiology: ordered.  Risk Prescription drug management.           Final Clinical Impression(s) / ED Diagnoses Final diagnoses:  None    Rx / DC Orders ED Discharge Orders     None         Josem Kaufmann 06/25/23 Babette Relic, MD 06/26/23 1017

## 2023-06-25 NOTE — Discharge Instructions (Addendum)
It was a pleasure taking care of you today.  You have a hernia of your umbilicus (bellybutton).  This is a condition where there is a defect in your muscles allowing some of the fat from inside of your abdomen to come through.  Unfortunately the fat is incarcerated or stuck.  This is causing inflammation.  Since there is not any bowel located within the hernia you do not need emergency surgery, however this gets much larger, you develop nausea or vomiting, develop a fever or have any other worsening symptoms need to come back to the ER right away.  Otherwise follow-up with the general surgeon.  You prescribed pain medicine to help with pain here.  You can continue using ice.  As we discussed the pain medicine can cause constipation.  Take MiraLAX or Colace daily while you are taking this, make sure you drink plenty of water.

## 2023-07-01 ENCOUNTER — Ambulatory Visit: Payer: BC Managed Care – PPO | Admitting: Surgery

## 2023-07-01 ENCOUNTER — Ambulatory Visit (HOSPITAL_COMMUNITY)
Admission: RE | Admit: 2023-07-01 | Discharge: 2023-07-01 | Disposition: A | Discharge status: Home / Self Care | Payer: BC Managed Care – PPO | Source: Ambulatory Visit | Attending: Gerontology | Admitting: Gerontology

## 2023-07-01 VITALS — BP 132/82 | HR 90 | Temp 98.1°F | Resp 12 | Ht 69.0 in | Wt 263.0 lb

## 2023-07-01 DIAGNOSIS — R109 Unspecified abdominal pain: Secondary | ICD-10-CM | POA: Insufficient documentation

## 2023-07-01 DIAGNOSIS — K429 Umbilical hernia without obstruction or gangrene: Secondary | ICD-10-CM | POA: Diagnosis not present

## 2023-07-01 DIAGNOSIS — R1905 Periumbilic swelling, mass or lump: Secondary | ICD-10-CM | POA: Diagnosis present

## 2023-07-01 NOTE — Progress Notes (Signed)
[facility] History and Physical  Reason for Referral: Umbilical hernia Referring Physician: ED referral  Chief Complaint   New Patient (Initial Visit)     Angel Powell is a 67 y.o. male.  HPI: Patient presents for evaluation of an umbilical hernia.  He notes that starting last week, he had increasing pain and swelling of his stomach.  He also noted that around his umbilicus, he had increased hardness.  He followed up with his primary care doctor, who ordered an abdominal ultrasound.  This was unable to be performed last week, which prompted him presenting to the emergency department for evaluation.  He underwent a CT of the abdomen and pelvis which demonstrated a fat-containing umbilical hernia with some soft tissue stranding concerning for strangulation/ischemia of the incarcerated fat, no herniated bowel loops noted.  While evaluated in the ED, he had no leukocytosis and a mild lactic acidosis of 3.  At that time, he was discharged and advised to follow-up with surgery.  He currently has been doing well.  He still has some pain at his umbilicus, but is tolerating a diet without nausea and vomiting, and moving his bowels without issue.  He has a past medical history significant for hyperlipidemia, GERD, and prediabetes.  He denies use of blood thinning medications.  He has never had any abdominal surgeries.  He denies use of tobacco products and illicit drugs.  He will rarely drink alcohol.  Past Medical History:  Diagnosis Date   GERD (gastroesophageal reflux disease)    Hypercholesteremia     Past Surgical History:  Procedure Laterality Date   bone spur     right   COLONOSCOPY  02/10/2007   Small internal hemorrhoids.  Otherwise normal colon.  Repeat in 10 years.   COLONOSCOPY WITH PROPOFOL N/A 07/25/2020   Procedure: COLONOSCOPY WITH PROPOFOL;  Surgeon: Corbin Ade, MD;  [facility]: [facility];  Service: Endoscopy;  Laterality: N/A;  7:30am   ROTATOR CUFF  REPAIR     left, [facility]; [place]    Family History  Problem Relation Age of Onset   Colon cancer Neg Hx     Social History   Tobacco Use   Smoking status: Former    Current packs/day: 0.00    Average packs/day: 1 pack/day for 0.5 years (0.5 ttl pk-yrs)    Types: Cigarettes    Start date: 08/19/2019    Quit date: 02/17/2020    Years since quitting: 3.3   Smokeless tobacco: Never  Vaping Use   Vaping status: Never Used  Substance Use Topics   Alcohol use: Not Currently    Comment: 2-3 beer every other day and a shot of liquor every other day; 05/13/20 "I quit"- last drank in June 2021   Drug use: No    Medications: I have reviewed the patient's current medications. Allergies as of 07/01/2023   No Known Allergies      Medication List        Accurate as of July 01, 2023 10:04 AM. If you have any questions, ask your nurse or doctor.          acetaminophen 500 MG tablet Commonly known as: TYLENOL Take 500-1,000 mg by mouth every 6 (six) hours as needed (back pain.).   atorvastatin 20 MG tablet Commonly known as: LIPITOR Take 20 mg by mouth daily.   BIOFREEZE EX Apply 1 application  topically 3 (three) times daily as needed (back pain.).   HAIR/SKIN/NAILS PO Take 1 tablet by  mouth daily.   HYDROcodone-acetaminophen 5-325 MG tablet Commonly known as: Norco Take 1 tablet by mouth every 6 (six) hours as needed for moderate pain.   ibuprofen 800 MG tablet Commonly known as: ADVIL Take 800 mg by mouth every 8 (eight) hours as needed.   indomethacin 25 MG capsule Commonly known as: INDOCIN TAKE 1 CAPSULE BY MOUTH 3 TIMES DAILY WITH MEALS.   linezolid 600 MG tablet Commonly known as: ZYVOX Take 1 tablet (600 mg total) by mouth 2 (two) times daily.   metFORMIN 500 MG tablet Commonly known as: GLUCOPHAGE Take 1 tablet (500 mg total) by mouth 2 (two) times daily with a meal.   multivitamin with minerals tablet Take 1 tablet by mouth daily.    naphazoline-glycerin 0.012-0.2 % Soln Commonly known as: CLEAR EYES REDNESS Place 1 drop into both eyes 4 (four) times daily as needed for eye irritation.   omega-3 acid ethyl esters 1 g capsule Commonly known as: LOVAZA Take 1 g by mouth daily.   omeprazole 40 MG capsule Commonly known as: PRILOSEC Take 40 mg by mouth daily.   tamsulosin 0.4 MG Caps capsule Commonly known as: FLOMAX Take 0.4 mg by mouth daily.   tiZANidine 4 MG tablet Commonly known as: ZANAFLEX TAKE 1 TABLET BY MOUTH EVERY DAY   Vitamin D (Ergocalciferol) 1.25 MG (50000 UNIT) Caps capsule Commonly known as: DRISDOL Take by mouth.         ROS:  Constitutional: negative for chills, fatigue, and fevers Eyes: positive for pain, negative for visual disturbance Ears, nose, mouth, throat, and face: negative for ear drainage, sore throat, and sinus problems Respiratory: negative for cough, wheezing, and shortness of breath Cardiovascular: negative for chest pain and palpitations Gastrointestinal: positive for abdominal pain, negative for nausea, reflux symptoms, and vomiting Genitourinary:positive for frequency, negative for dysuria Integument/breast: negative for dryness and rash Hematologic/lymphatic: positive for lymphadenopathy, negative for bleeding Musculoskeletal:positive for back pain, negative for neck pain Neurological: negative for dizziness and tremors Endocrine: negative for temperature intolerance  Blood pressure 132/82, pulse 90, temperature 98.1 F (36.7 C), temperature source Oral, resp. rate 12, height 5\' 9"  (1.753 m), weight 263 lb (119.3 kg), SpO2 91%. Physical Exam Vitals reviewed.  Constitutional:      Appearance: Normal appearance.  HENT:     Head: Normocephalic and atraumatic.  Eyes:     Extraocular Movements: Extraocular movements intact.     Pupils: Pupils are equal, round, and reactive to light.  Cardiovascular:     Rate and Rhythm: Normal rate and regular rhythm.   Pulmonary:     Effort: Pulmonary effort is normal.     Breath sounds: Normal breath sounds.  Abdominal:     Comments: Abdomen soft, nondistended, no percussion tenderness, mild tenderness around umbilical hernia site; no rigidity, guarding, rebound tenderness; nonreducible umbilical hernia, mild tenderness on palpation, no overlying skin changes  Musculoskeletal:        General: Normal range of motion.     Cervical back: Normal range of motion.  Skin:    General: Skin is warm and dry.  Neurological:     General: No focal deficit present.     Mental Status: He is alert and oriented to person, place, and time.  Psychiatric:        Mood and Affect: Mood normal.        Behavior: Behavior normal.     Results: CT of the abdomen and pelvis (06/25/2023): IMPRESSION: Small fat-containing umbilical hernia. Soft tissue stranding  throughout the herniated fat is suspicious for strangulation/ischemia. No herniated bowel loops.   Mildly enlarged prostate. Diffuse bladder wall thickening, which may be due to cystitis or chronic bladder outlet obstruction.   Mild sigmoid diverticulosis, without radiographic evidence of diverticulitis.  Abdominal ultrasound (07/01/2023): IMPRESSION: 1. No acute abnormality identified. 2. Fatty infiltration of the liver.   Assessment & Plan:  Angel Powell is a 67 y.o. male who presents for evaluation of an incarcerated umbilical hernia containing fat.  -I discussed the pathophysiology of hernias and we discussed the need for surgical repair -The risk and benefits of robotic assisted laparoscopic umbilical hernia repair with mesh were discussed including but not limited to bleeding, infection, injury to surrounding structures, need for additional procedures, and hernia recurrence.  After careful consideration, Angel Powell has decided to proceed with surgery.  -Patient tentatively scheduled for surgery on 9/27 -Information provided to the patient regarding  umbilical hernias -Advised that the patient should present to the ED if they begin to have severe worsening of pain at his hernia site, nausea, vomiting, and obstipation  All questions were answered to the satisfaction of the patient and family.  Angel Kinds, DO [facility] [address] [place] [place], [place] [address] [phone] (office)

## 2023-07-02 NOTE — H&P (Signed)
[facility] History and Physical  Reason for Referral: Umbilical hernia Referring Physician: ED referral  Chief Complaint   New Patient (Initial Visit)     Angel Powell is a 67 y.o. male.  HPI: Patient presents for evaluation of an umbilical hernia.  He notes that starting last week, he had increasing pain and swelling of his stomach.  He also noted that around his umbilicus, he had increased hardness.  He followed up with his primary care doctor, who ordered an abdominal ultrasound.  This was unable to be performed last week, which prompted him presenting to the emergency department for evaluation.  He underwent a CT of the abdomen and pelvis which demonstrated a fat-containing umbilical hernia with some soft tissue stranding concerning for strangulation/ischemia of the incarcerated fat, no herniated bowel loops noted.  While evaluated in the ED, he had no leukocytosis and a mild lactic acidosis of 3.  At that time, he was discharged and advised to follow-up with surgery.  He currently has been doing well.  He still has some pain at his umbilicus, but is tolerating a diet without nausea and vomiting, and moving his bowels without issue.  He has a past medical history significant for hyperlipidemia, GERD, and prediabetes.  He denies use of blood thinning medications.  He has never had any abdominal surgeries.  He denies use of tobacco products and illicit drugs.  He will rarely drink alcohol.  Past Medical History:  Diagnosis Date   GERD (gastroesophageal reflux disease)    Hypercholesteremia     Past Surgical History:  Procedure Laterality Date   bone spur     right   COLONOSCOPY  02/10/2007   Small internal hemorrhoids.  Otherwise normal colon.  Repeat in 10 years.   COLONOSCOPY WITH PROPOFOL N/A 07/25/2020   Procedure: COLONOSCOPY WITH PROPOFOL;  Surgeon: Corbin Ade, MD;  Location: [facility];  Service: Endoscopy;  Laterality: N/A;  7:30am   ROTATOR CUFF  REPAIR     left, [facility]; [place]    Family History  Problem Relation Age of Onset   Colon cancer Neg Hx     Social History   Tobacco Use   Smoking status: Former    Current packs/day: 0.00    Average packs/day: 1 pack/day for 0.5 years (0.5 ttl pk-yrs)    Types: Cigarettes    Start date: 08/19/2019    Quit date: 02/17/2020    Years since quitting: 3.3   Smokeless tobacco: Never  Vaping Use   Vaping status: Never Used  Substance Use Topics   Alcohol use: Not Currently    Comment: 2-3 beer every other day and a shot of liquor every other day; 05/13/20 "I quit"- last drank in June 2021   Drug use: No    Medications: I have reviewed the patient's current medications. Allergies as of 07/01/2023   No Known Allergies      Medication List        Accurate as of July 01, 2023 10:04 AM. If you have any questions, ask your nurse or doctor.          acetaminophen 500 MG tablet Commonly known as: TYLENOL Take 500-1,000 mg by mouth every 6 (six) hours as needed (back pain.).   atorvastatin 20 MG tablet Commonly known as: LIPITOR Take 20 mg by mouth daily.   BIOFREEZE EX Apply 1 application  topically 3 (three) times daily as needed (back pain.).   HAIR/SKIN/NAILS PO Take 1 tablet by  mouth daily.   HYDROcodone-acetaminophen 5-325 MG tablet Commonly known as: Norco Take 1 tablet by mouth every 6 (six) hours as needed for moderate pain.   ibuprofen 800 MG tablet Commonly known as: ADVIL Take 800 mg by mouth every 8 (eight) hours as needed.   indomethacin 25 MG capsule Commonly known as: INDOCIN TAKE 1 CAPSULE BY MOUTH 3 TIMES DAILY WITH MEALS.   linezolid 600 MG tablet Commonly known as: ZYVOX Take 1 tablet (600 mg total) by mouth 2 (two) times daily.   metFORMIN 500 MG tablet Commonly known as: GLUCOPHAGE Take 1 tablet (500 mg total) by mouth 2 (two) times daily with a meal.   multivitamin with minerals tablet Take 1 tablet by mouth daily.    naphazoline-glycerin 0.012-0.2 % Soln Commonly known as: CLEAR EYES REDNESS Place 1 drop into both eyes 4 (four) times daily as needed for eye irritation.   omega-3 acid ethyl esters 1 g capsule Commonly known as: LOVAZA Take 1 g by mouth daily.   omeprazole 40 MG capsule Commonly known as: PRILOSEC Take 40 mg by mouth daily.   tamsulosin 0.4 MG Caps capsule Commonly known as: FLOMAX Take 0.4 mg by mouth daily.   tiZANidine 4 MG tablet Commonly known as: ZANAFLEX TAKE 1 TABLET BY MOUTH EVERY DAY   Vitamin D (Ergocalciferol) 1.25 MG (50000 UNIT) Caps capsule Commonly known as: DRISDOL Take by mouth.         ROS:  Constitutional: negative for chills, fatigue, and fevers Eyes: positive for pain, negative for visual disturbance Ears, nose, mouth, throat, and face: negative for ear drainage, sore throat, and sinus problems Respiratory: negative for cough, wheezing, and shortness of breath Cardiovascular: negative for chest pain and palpitations Gastrointestinal: positive for abdominal pain, negative for nausea, reflux symptoms, and vomiting Genitourinary:positive for frequency, negative for dysuria Integument/breast: negative for dryness and rash Hematologic/lymphatic: positive for lymphadenopathy, negative for bleeding Musculoskeletal:positive for back pain, negative for neck pain Neurological: negative for dizziness and tremors Endocrine: negative for temperature intolerance  Blood pressure 132/82, pulse 90, temperature 98.1 F (36.7 C), temperature source Oral, resp. rate 12, height 5\' 9"  (1.753 m), weight 263 lb (119.3 kg), SpO2 91%. Physical Exam Vitals reviewed.  Constitutional:      Appearance: Normal appearance.  HENT:     Head: Normocephalic and atraumatic.  Eyes:     Extraocular Movements: Extraocular movements intact.     Pupils: Pupils are equal, round, and reactive to light.  Cardiovascular:     Rate and Rhythm: Normal rate and regular rhythm.   Pulmonary:     Effort: Pulmonary effort is normal.     Breath sounds: Normal breath sounds.  Abdominal:     Comments: Abdomen soft, nondistended, no percussion tenderness, mild tenderness around umbilical hernia site; no rigidity, guarding, rebound tenderness; nonreducible umbilical hernia, mild tenderness on palpation, no overlying skin changes  Musculoskeletal:        General: Normal range of motion.     Cervical back: Normal range of motion.  Skin:    General: Skin is warm and dry.  Neurological:     General: No focal deficit present.     Mental Status: He is alert and oriented to person, place, and time.  Psychiatric:        Mood and Affect: Mood normal.        Behavior: Behavior normal.     Results: CT of the abdomen and pelvis (06/25/2023): IMPRESSION: Small fat-containing umbilical hernia. Soft tissue stranding  throughout the herniated fat is suspicious for strangulation/ischemia. No herniated bowel loops.   Mildly enlarged prostate. Diffuse bladder wall thickening, which may be due to cystitis or chronic bladder outlet obstruction.   Mild sigmoid diverticulosis, without radiographic evidence of diverticulitis.  Abdominal ultrasound (07/01/2023): IMPRESSION: 1. No acute abnormality identified. 2. Fatty infiltration of the liver.   Assessment & Plan:  Angel Powell is a 67 y.o. male who presents for evaluation of an incarcerated umbilical hernia containing fat.  -I discussed the pathophysiology of hernias and we discussed the need for surgical repair -The risk and benefits of robotic assisted laparoscopic umbilical hernia repair with mesh were discussed including but not limited to bleeding, infection, injury to surrounding structures, need for additional procedures, and hernia recurrence.  After careful consideration, Angel Powell has decided to proceed with surgery.  -Patient tentatively scheduled for surgery on 9/27 -Information provided to the patient regarding  umbilical hernias -Advised that the patient should present to the ED if they begin to have severe worsening of pain at his hernia site, nausea, vomiting, and obstipation  All questions were answered to the satisfaction of the patient and family.  Angel Kinds, Angel Powell [facility] [address] [place], [place] [address] [phone] (office)

## 2023-07-13 ENCOUNTER — Telehealth: Payer: Self-pay | Admitting: Family Medicine

## 2023-07-13 NOTE — Telephone Encounter (Signed)
Patient requesting out of work starting 06/26/2023 until 08/26/2023. Per Dr. Robyne Peers will agree to start his FMLA at the first time we saw him which is 07/01/2023. He can see his PCP and have them fill out if time needed prior to tat date.  Paperwork completed and faxed to [employer] at [phone]. Received confirmation.  Out of work 07/01/2023 and may return unrestricted on 08/30/2023.

## 2023-07-14 ENCOUNTER — Encounter (HOSPITAL_COMMUNITY)
Admission: RE | Admit: 2023-07-14 | Discharge: 2023-07-14 | Disposition: A | Discharge status: Home / Self Care | Payer: BC Managed Care – PPO | Source: Ambulatory Visit | Attending: Surgery | Admitting: Surgery

## 2023-07-16 ENCOUNTER — Encounter (HOSPITAL_COMMUNITY): Payer: Self-pay | Admitting: Surgery

## 2023-07-16 ENCOUNTER — Encounter (HOSPITAL_COMMUNITY)
Admission: RE | Disposition: A | Discharge status: Home / Self Care | Payer: Self-pay | Source: Home / Self Care | Attending: Surgery

## 2023-07-16 ENCOUNTER — Ambulatory Visit (HOSPITAL_COMMUNITY): Payer: BC Managed Care – PPO | Admitting: Certified Registered Nurse Anesthetist

## 2023-07-16 ENCOUNTER — Other Ambulatory Visit: Payer: Self-pay

## 2023-07-16 ENCOUNTER — Ambulatory Visit (HOSPITAL_COMMUNITY)
Admission: RE | Admit: 2023-07-16 | Discharge: 2023-07-16 | Disposition: A | Discharge status: Home / Self Care | Payer: BC Managed Care – PPO | Attending: Surgery | Admitting: Surgery

## 2023-07-16 DIAGNOSIS — K420 Umbilical hernia with obstruction, without gangrene: Secondary | ICD-10-CM | POA: Diagnosis present

## 2023-07-16 DIAGNOSIS — Z87891 Personal history of nicotine dependence: Secondary | ICD-10-CM | POA: Diagnosis not present

## 2023-07-16 DIAGNOSIS — K429 Umbilical hernia without obstruction or gangrene: Secondary | ICD-10-CM | POA: Diagnosis not present

## 2023-07-16 DIAGNOSIS — K219 Gastro-esophageal reflux disease without esophagitis: Secondary | ICD-10-CM | POA: Diagnosis not present

## 2023-07-16 DIAGNOSIS — E785 Hyperlipidemia, unspecified: Secondary | ICD-10-CM | POA: Diagnosis not present

## 2023-07-16 DIAGNOSIS — K439 Ventral hernia without obstruction or gangrene: Secondary | ICD-10-CM | POA: Diagnosis not present

## 2023-07-16 DIAGNOSIS — E119 Type 2 diabetes mellitus without complications: Secondary | ICD-10-CM | POA: Insufficient documentation

## 2023-07-16 LAB — GLUCOSE, CAPILLARY: Glucose-Capillary: 137 mg/dL — ABNORMAL HIGH (ref 70–99)

## 2023-07-16 LAB — NO BLOOD PRODUCTS

## 2023-07-16 SURGERY — REPAIR, HERNIA, UMBILICAL, ROBOT-ASSISTED
Anesthesia: General | Site: Abdomen

## 2023-07-16 MED ORDER — STERILE WATER FOR IRRIGATION IR SOLN
Status: DC | PRN
Start: 2023-07-16 — End: 2023-07-16
  Administered 2023-07-16: 500 mL

## 2023-07-16 MED ORDER — BUPIVACAINE HCL (PF) 0.5 % IJ SOLN
INTRAMUSCULAR | Status: DC | PRN
  Administered 2023-07-16: 30 mL

## 2023-07-16 MED ORDER — CHLORHEXIDINE GLUCONATE CLOTH 2 % EX PADS: 6.0000 | MEDICATED_PAD | Freq: Once | CUTANEOUS | Status: DC

## 2023-07-16 MED ORDER — CEFAZOLIN SODIUM-DEXTROSE 2-4 GM/100ML-% IV SOLN
INTRAVENOUS | Status: AC
  Filled 2023-07-16: qty 100

## 2023-07-16 MED ORDER — MIDAZOLAM HCL 2 MG/2ML IJ SOLN
INTRAMUSCULAR | Status: DC | PRN
  Administered 2023-07-16: 2 mg via INTRAVENOUS

## 2023-07-16 MED ORDER — ROCURONIUM BROMIDE 10 MG/ML (PF) SYRINGE
PREFILLED_SYRINGE | INTRAVENOUS | Status: DC | PRN
  Administered 2023-07-16 (×2): 20 mg via INTRAVENOUS
  Administered 2023-07-16: 60 mg via INTRAVENOUS

## 2023-07-16 MED ORDER — ONDANSETRON HCL 4 MG/2ML IJ SOLN: 4.0000 mg | Freq: Once | INTRAMUSCULAR | Status: DC | PRN

## 2023-07-16 MED ORDER — DEXAMETHASONE SODIUM PHOSPHATE 10 MG/ML IJ SOLN
INTRAMUSCULAR | Status: AC
  Filled 2023-07-16: qty 1

## 2023-07-16 MED ORDER — BUPIVACAINE HCL (PF) 0.5 % IJ SOLN
INTRAMUSCULAR | Status: AC
  Filled 2023-07-16: qty 30

## 2023-07-16 MED ORDER — DEXAMETHASONE SODIUM PHOSPHATE 10 MG/ML IJ SOLN
INTRAMUSCULAR | Status: DC | PRN
  Administered 2023-07-16: 5 mg via INTRAVENOUS

## 2023-07-16 MED ORDER — FENTANYL CITRATE PF 50 MCG/ML IJ SOSY
25.0000 ug | PREFILLED_SYRINGE | INTRAMUSCULAR | Status: DC | PRN
  Administered 2023-07-16 (×2): 50 ug via INTRAVENOUS
  Filled 2023-07-16 (×2): qty 1

## 2023-07-16 MED ORDER — MIDAZOLAM HCL 2 MG/2ML IJ SOLN
INTRAMUSCULAR | Status: AC
  Filled 2023-07-16: qty 2

## 2023-07-16 MED ORDER — FENTANYL CITRATE (PF) 100 MCG/2ML IJ SOLN
INTRAMUSCULAR | Status: DC | PRN
  Administered 2023-07-16: 50 ug via INTRAVENOUS
  Administered 2023-07-16: 25 ug via INTRAVENOUS
  Administered 2023-07-16: 50 ug via INTRAVENOUS
  Administered 2023-07-16: 100 ug via INTRAVENOUS
  Administered 2023-07-16 (×2): 50 ug via INTRAVENOUS
  Administered 2023-07-16: 25 ug via INTRAVENOUS

## 2023-07-16 MED ORDER — OXYCODONE HCL 5 MG PO TABS
5.0000 mg | ORAL_TABLET | Freq: Four times a day (QID) | ORAL | 0 refills | Status: DC | PRN
Start: 2023-07-16 — End: 2023-11-15

## 2023-07-16 MED ORDER — ACETAMINOPHEN 500 MG PO TABS: 1000.0000 mg | ORAL_TABLET | Freq: Four times a day (QID) | ORAL | 0 refills | Status: AC

## 2023-07-16 MED ORDER — OXYCODONE HCL 5 MG/5ML PO SOLN: 5.0000 mg | Freq: Once | ORAL | Status: AC | PRN

## 2023-07-16 MED ORDER — SUGAMMADEX SODIUM 200 MG/2ML IV SOLN
INTRAVENOUS | Status: DC | PRN
  Administered 2023-07-16: 300 mg via INTRAVENOUS

## 2023-07-16 MED ORDER — FENTANYL CITRATE (PF) 100 MCG/2ML IJ SOLN
INTRAMUSCULAR | Status: AC
  Filled 2023-07-16: qty 2

## 2023-07-16 MED ORDER — PROPOFOL 10 MG/ML IV BOLUS
INTRAVENOUS | Status: DC | PRN
  Administered 2023-07-16: 200 mg via INTRAVENOUS

## 2023-07-16 MED ORDER — LACTATED RINGERS IV SOLN: INTRAVENOUS | Status: DC

## 2023-07-16 MED ORDER — LIDOCAINE HCL (CARDIAC) PF 100 MG/5ML IV SOSY
PREFILLED_SYRINGE | INTRAVENOUS | Status: DC | PRN
  Administered 2023-07-16: 60 mg via INTRATRACHEAL

## 2023-07-16 MED ORDER — OXYCODONE HCL 5 MG PO TABS
5.0000 mg | ORAL_TABLET | Freq: Once | ORAL | Status: AC | PRN
  Administered 2023-07-16: 5 mg via ORAL
  Filled 2023-07-16: qty 1

## 2023-07-16 MED ORDER — PROPOFOL 10 MG/ML IV BOLUS
INTRAVENOUS | Status: AC
  Filled 2023-07-16: qty 20

## 2023-07-16 MED ORDER — DOCUSATE SODIUM 100 MG PO CAPS
100.0000 mg | ORAL_CAPSULE | Freq: Two times a day (BID) | ORAL | 2 refills | Status: DC
Start: 2023-07-16 — End: 2023-11-15

## 2023-07-16 MED ORDER — ONDANSETRON HCL 4 MG/2ML IJ SOLN
INTRAMUSCULAR | Status: DC | PRN
  Administered 2023-07-16: 4 mg via INTRAVENOUS

## 2023-07-16 MED ORDER — ORAL CARE MOUTH RINSE: 15.0000 mL | Freq: Once | OROMUCOSAL | Status: AC

## 2023-07-16 MED ORDER — LIDOCAINE HCL (PF) 2 % IJ SOLN
INTRAMUSCULAR | Status: AC
  Filled 2023-07-16: qty 5

## 2023-07-16 MED ORDER — CHLORHEXIDINE GLUCONATE 0.12 % MT SOLN
15.0000 mL | Freq: Once | OROMUCOSAL | Status: AC
  Administered 2023-07-16: 15 mL via OROMUCOSAL

## 2023-07-16 MED ORDER — ONDANSETRON HCL 4 MG/2ML IJ SOLN
INTRAMUSCULAR | Status: AC
  Filled 2023-07-16: qty 2

## 2023-07-16 MED ORDER — CEFAZOLIN SODIUM-DEXTROSE 2-3 GM-%(50ML) IV SOLR
INTRAVENOUS | Status: DC | PRN
Start: 2023-07-16 — End: 2023-07-16
  Administered 2023-07-16: 2 g via INTRAVENOUS

## 2023-07-16 MED ORDER — FENTANYL CITRATE (PF) 250 MCG/5ML IJ SOLN
INTRAMUSCULAR | Status: AC
  Filled 2023-07-16: qty 5

## 2023-07-16 MED ORDER — CEFAZOLIN IN SODIUM CHLORIDE 3-0.9 GM/100ML-% IV SOLN: 3.0000 g | INTRAVENOUS | Status: DC

## 2023-07-16 MED ORDER — ROCURONIUM BROMIDE 10 MG/ML (PF) SYRINGE
PREFILLED_SYRINGE | INTRAVENOUS | Status: AC
  Filled 2023-07-16: qty 10

## 2023-07-16 SURGICAL SUPPLY — 50 items
ADH SKN CLS APL DERMABOND .7 (GAUZE/BANDAGES/DRESSINGS) ×1
APL PRP STRL LF DISP 70% ISPRP (MISCELLANEOUS) ×1
BLADE SURG 15 STRL LF DISP TIS (BLADE) ×1 IMPLANT
BLADE SURG 15 STRL SS (BLADE) ×1
CHLORAPREP W/TINT 26 (MISCELLANEOUS) ×1 IMPLANT
COVER LIGHT HANDLE STERIS (MISCELLANEOUS) ×2 IMPLANT
COVER MAYO STAND XLG (MISCELLANEOUS) ×1 IMPLANT
COVER TIP SHEARS 8 DVNC (MISCELLANEOUS) ×1 IMPLANT
DEFOGGER SCOPE WARMER CLEARIFY (MISCELLANEOUS) ×1 IMPLANT
DERMABOND ADVANCED .7 DNX12 (GAUZE/BANDAGES/DRESSINGS) ×1 IMPLANT
DRAPE ARM DVNC X/XI (DISPOSABLE) ×3 IMPLANT
DRAPE COLUMN DVNC XI (DISPOSABLE) ×1 IMPLANT
ELECT REM PT RETURN 9FT ADLT (ELECTROSURGICAL) ×1
ELECTRODE REM PT RTRN 9FT ADLT (ELECTROSURGICAL) ×1 IMPLANT
FORCEPS BPLR FENES DVNC XI (FORCEP) ×1 IMPLANT
GLOVE BIOGEL PI IND STRL 6.5 (GLOVE) ×2 IMPLANT
GLOVE BIOGEL PI IND STRL 7.0 (GLOVE) ×2 IMPLANT
GLOVE SURG SS PI 6.5 STRL IVOR (GLOVE) ×2 IMPLANT
GOWN STRL REUS W/TWL LRG LVL3 (GOWN DISPOSABLE) ×3 IMPLANT
KIT TURNOVER KIT A (KITS) ×1 IMPLANT
MANIFOLD NEPTUNE II (INSTRUMENTS) ×1 IMPLANT
MESH VENTRALIGHT ST 4.5IN (Mesh General) IMPLANT
NDL DRIVE SUT CUT DVNC (INSTRUMENTS) ×1 IMPLANT
NDL HYPO 21X1.5 SAFETY (NEEDLE) ×1 IMPLANT
NDL INSUFFLATION 14GA 120MM (NEEDLE) ×1 IMPLANT
NEEDLE DRIVE SUT CUT DVNC (INSTRUMENTS) ×1
NEEDLE HYPO 21X1.5 SAFETY (NEEDLE) ×1
NEEDLE INSUFFLATION 14GA 120MM (NEEDLE) ×1
OBTURATOR OPTICAL STND 8 DVNC (TROCAR) ×1
OBTURATOR OPTICALSTD 8 DVNC (TROCAR) ×1 IMPLANT
PACK LAP CHOLE LZT030E (CUSTOM PROCEDURE TRAY) ×1 IMPLANT
PENCIL HANDSWITCHING (ELECTRODE) ×1 IMPLANT
POSITIONER HEAD 8X9X4 ADT (SOFTGOODS) ×1 IMPLANT
SCISSORS MNPLR CVD DVNC XI (INSTRUMENTS) ×1 IMPLANT
SEAL UNIV 5-12 XI (MISCELLANEOUS) ×3 IMPLANT
SET BASIN LINEN APH (SET/KITS/TRAYS/PACK) ×1 IMPLANT
SET TUBE DA VINCI INSUFFLATOR (TUBING) IMPLANT
SUT MNCRL AB 4-0 PS2 18 (SUTURE) ×1 IMPLANT
SUT STRATAFIX 0 PDS+ CT-2 23 (SUTURE) ×1
SUT V-LOC 90 ABS 3-0 VLT V-20 (SUTURE) ×2 IMPLANT
SUT VLOC BARB 180 ABS3/0GR12 (SUTURE) ×1
SUTURE STRATFX 0 PDS+ CT-2 23 (SUTURE) ×1 IMPLANT
SUTURE VLOC BRB 180 ABS3/0GR12 (SUTURE) IMPLANT
SYR 30ML LL (SYRINGE) ×1 IMPLANT
SYS RETRIEVAL 5MM INZII UNIV (BASKET) ×1
SYSTEM RETRIEVL 5MM INZII UNIV (BASKET) IMPLANT
TAPE TRANSPORE STRL 2 31045 (GAUZE/BANDAGES/DRESSINGS) ×1 IMPLANT
TRAY FOLEY W/BAG SLVR 16FR (SET/KITS/TRAYS/PACK) ×1
TRAY FOLEY W/BAG SLVR 16FR ST (SET/KITS/TRAYS/PACK) ×1 IMPLANT
WATER STERILE IRR 500ML POUR (IV SOLUTION) ×1 IMPLANT

## 2023-07-16 NOTE — Progress Notes (Signed)
[facility]  Spoke with the patient's wife in the consultation room.  I explained that he tolerated the procedure without difficulty.  He has dissolvable stitches under the skin with overlying skin glue.  This will flake off in 10 to 14 days.  I discharged him home with a prescription for narcotic pain medication that they should take as needed for pain.  I also want him taking scheduled Tylenol.  If they take the narcotic pain medication, they should take a stool softener as well.  The patient will follow-up with me in 2 weeks.  All questions were answered to her expressed satisfaction.  Tobi Leinweber, DO [facility] [address] [place], [place] [address] [phone] (office)  

## 2023-07-16 NOTE — Discharge Instructions (Signed)
Ambulatory Surgery Discharge Instructions  General Anesthesia or Sedation Do not drive or operate heavy machinery for 24 hours.  Do not consume alcohol, tranquilizers, sleeping medications, or any non-prescribed medications for 24 hours. Do not make important decisions or sign any important papers in the next 24 hours. You should have someone with you tonight at home.  Activity  You are advised to go directly home from the hospital.  Restrict your activities and rest for a day.  Resume light activity tomorrow. No heavy lifting over 10 lbs or strenuous exercise.  Fluids and Diet Begin with clear liquids, bouillon, dry toast, soda crackers.  If not nauseated, you may go to a regular diet when you desire.  Greasy and spicy foods are not advised.  Medications  If you have not had a bowel movement in 24 hours, take 2 tablespoons over the counter Milk of mag.             You May resume your blood thinners tomorrow (Aspirin, coumadin, or other).  You are being discharged with prescriptions for Opioid/Narcotic Medications: There are some specific considerations for these medications that you should know. Opioid Meds have risks & benefits. Addiction to these meds is always a concern with prolonged use Take medication only as directed Do not drive while taking narcotic pain medication Do not crush tablets or capsules Do not use a different container than medication was dispensed in Lock the container of medication in a cool, dry place out of reach of children and pets. Opioid medication can cause addiction Do not share with anyone else (this is a felony) Do not store medications for future use. Dispose of them properly.     Disposal:  Find a [facility] household drug take back site near you.  If you can't get to a drug take back site, use the recipe below as a last resort to dispose of expired, unused or unwanted drugs. Disposal  (Do not dispose chemotherapy drugs this way, talk to your  prescribing doctor instead.) Step 1: Mix drugs (do not crush) with dirt, kitty litter, or used coffee grounds and add a small amount of water to dissolve any solid medications. Step 2: Seal drugs in plastic bag. Step 3: Place plastic bag in trash. Step 4: Take prescription container and scratch out personal information, then recycle or throw away.  Operative Site  You have a liquid bandage over your incisions, this will begin to flake off in about a week. Ok to shower tomorrow. Keep wound clean and dry. No baths or swimming. No lifting more than 10 pounds.  Contact Information: If you have questions or concerns, please call our office, [phone], Monday- Thursday 8AM-5PM and Friday 8AM-12Noon.  If it is after hours or on the weekend, please call [facility] Main Number, [phone], and ask to speak to the surgeon on call for Dr. Bahar Shelden at [facility].   SPECIFIC COMPLICATIONS TO WATCH FOR: Inability to urinate Fever over 101? F by mouth Nausea and vomiting lasting longer than 24 hours. Pain not relieved by medication ordered Swelling around the operative site Increased redness, warmth, hardness, around operative area Numbness, tingling, or cold fingers or toes Blood -soaked dressing, (small amounts of oozing may be normal) Increasing and progressive drainage from surgical area or exam site  

## 2023-07-16 NOTE — Anesthesia Preprocedure Evaluation (Signed)
Anesthesia Evaluation  Patient identified by MRN, date of birth, ID band Patient awake    Reviewed: Allergy & Precautions, H&P , NPO status , Patient's Chart, lab work & pertinent test results, reviewed documented beta blocker date and time   Airway Mallampati: II  TM Distance: >3 FB Neck ROM: full    Dental no notable dental hx.    Pulmonary neg pulmonary ROS, former smoker   Pulmonary exam normal breath sounds clear to auscultation       Cardiovascular Exercise Tolerance: Good negative cardio ROS  Rhythm:regular Rate:Normal     Neuro/Psych  Neuromuscular disease negative neurological ROS  negative psych ROS   GI/Hepatic negative GI ROS, Neg liver ROS,GERD  ,,  Endo/Other  negative endocrine ROSdiabetes    Renal/GU negative Renal ROS  negative genitourinary   Musculoskeletal   Abdominal   Peds  Hematology negative hematology ROS (+)   Anesthesia Other Findings   Reproductive/Obstetrics negative OB ROS                             Anesthesia Physical Anesthesia Plan  ASA: 2  Anesthesia Plan: General   Post-op Pain Management:    Induction:   PONV Risk Score and Plan: Propofol infusion  Airway Management Planned:   Additional Equipment:   Intra-op Plan:   Post-operative Plan:   Informed Consent: I have reviewed the patients History and Physical, chart, labs and discussed the procedure including the risks, benefits and alternatives for the proposed anesthesia with the patient or authorized representative who has indicated his/her understanding and acceptance.     Dental Advisory Given  Plan Discussed with: CRNA  Anesthesia Plan Comments:        Anesthesia Quick Evaluation

## 2023-07-16 NOTE — Op Note (Signed)
[facility] Operative Note  07/16/23  Preoperative Diagnosis: Incarcerated umbilical hernia   Postoperative Diagnosis: 3 cm incarcerated umbilical hernia, 1.5 cm supraumbilical hernia, 2 cm infraumbilical hernia   Procedure(s) Performed: Robotic assisted laparoscopic umbilical hernia and ventral hernia x 2 repair with mesh, defect size 6.5 cm total (3 cm umbilical hernia, 1.5 cm supraumbilical hernia, 2 cm infraumbilical hernia)   Surgeon: Theophilus Kinds, DO   Assistants: No qualified resident was available    Anesthesia: General endotracheal   Anesthesiologist: Dr. Johnnette Litter   Specimens: None   Estimated Blood Loss: Minimal   Blood Replacement: None    Complications: None   Wound Class: Clean   Operative Indications: Patient is a 67 year old male who presents for repair of his umbilical hernia.  He has an incarcerated umbilical hernia that causes him pain, and he would like it repaired at this time.  He has had CT imaging that verifies that the hernia only contains fat.  He is agreeable to surgery at this time.  All risks and benefits of performing this procedure were discussed with the patient including pain, infection, bleeding, damage to the surrounding structures, and need for more procedures or surgery. The patient voiced understanding of the procedure, all questions were sought and answered, and consent was obtained.   Findings: 6.5 cm total defect (3 cm umbilical hernia, 1.5 cm supraumbilical hernia, 2 cm infraumbilical hernia) Tension free repair achieved with Ventralight 4.5 inch mesh and suture Adequate hemostasis    Procedure: The patient was brought to the operating room and general anesthesia was induced. A time-out was completed verifying correct patient, procedure, site, positioning, and implant(s) and/or special equipment prior to beginning this procedure. Antibiotics were administered prior to making the incision. SCDs placed. The anterior  abdominal wall was prepped and draped in the standard sterile fashion.   Palmer's point was used and a stab incision was made. A towel clip was used to elevate the abdominal wall. A Veress needle placed and the saline drop test was performed. The abdomen was insufflated to 15cm without issues.  2 additional left lateral ports were placed using the optiview ports inferior to the Palmer's point port, 8 cm apart.  The [place] robot then docked into place.  The hernia was noted to be at the umbilicus and incarcerated containing fat. The hernia contents were noted and reduced with combination of blunt, sharp dissection with scissors and fenestrated forceps.  Hemostasis achieved throughout this portion.  Once all hernia were contents reduced, there was noted to be a 3 cm umbilical hernia defect.  Any fat overlying the peritoneal layer was taken down creating flaps to clear the space for adequate fixation of the mesh.  After taking down the fat overlying the peritoneal layer, there was an additional 1.5 cm supraumbilical hernia noted and a 2 cm infraumbilical hernia noted.  The 4.5 inch Ventralight mesh and strata fix suture and 330 V-Loc sutures were placed in the abdominal cavity under direct visualization.  A transfacial suture with 0 stratafix used to primarily close the defects under minimal tension. The stratafix was then passed through the center of the mesh (coated side out) to secured the mesh to the abdominal wall centered over the defect.  The mesh was then circumferentially sutured into the anterior abdominal wall using 3-0 VLock x3.  Any bleeding noted during this portion was no longer actively bleeding by end of securing mesh and tightening the suture.     The robot was undocked.  The  abdomen was then de-sufflated and the ports were removed.  All skin incisions closed with subcuticular 4-0 Monocryl and dermabond. Final inspection revealed acceptable hemostasis.   All counts were correct at the end of the  case. The patient was awakened from anesthesia and extubated without complication.  The patient went to the PACU in stable condition.   Theophilus Kinds, DO [facility] [address] [place], [place] [address] [phone] (office)

## 2023-07-16 NOTE — Interval H&P Note (Signed)
History and Physical Interval Note:  07/16/2023 7:59 AM  Angel Powell  has presented today for surgery, with the diagnosis of UMBILICAL HERNIA 2 CM.  The various methods of treatment have been discussed with the patient and family. After consideration of risks, benefits and other options for treatment, the patient has consented to  Procedure(s): XI ROBOT ASSISTED UMBILICAL HERNIA REPAIR W/ MESH (N/A) as a surgical intervention.  The patient's history has been reviewed, patient examined, no change in status, stable for surgery.  I have reviewed the patient's chart and labs.  Questions were answered to the patient's satisfaction.     Shadawn Hanaway A Renelle Stegenga

## 2023-07-16 NOTE — Transfer of Care (Signed)
Immediate Anesthesia Transfer of Care Note  Patient: Angel Powell  Procedure(s) Performed: XI ROBOT ASSISTED UMBILICAL HERNIA REPAIR W/ MESH (Abdomen)  Patient Location: PACU  Anesthesia Type:General  Level of Consciousness: awake  Airway & Oxygen Therapy: Patient Spontanous Breathing and Patient connected to face mask oxygen  Post-op Assessment: Report given to RN and Post -op Vital signs reviewed and stable  Post vital signs: Reviewed and stable  Last Vitals:  Vitals Value Taken Time  BP 122/85   Temp 98.2   Pulse 89 07/16/23 1034  Resp 22 07/16/23 1034  SpO2 97 % 07/16/23 1034  Vitals shown include unfiled device data.  Last Pain:  Vitals:   07/16/23 0654  TempSrc: Oral  PainSc: 0-No pain         Complications:  Encounter Notable Events  Notable Event Outcome Phase Comment  Difficult to intubate - expected  Intraprocedure Filed from anesthesia note documentation.

## 2023-07-16 NOTE — Anesthesia Procedure Notes (Addendum)
Procedure Name: Intubation Date/Time: 07/16/2023 8:12 AM  Performed by: Lorin Glass, CRNAPre-anesthesia Checklist: Patient identified, Emergency Drugs available, Suction available and Patient being monitored Patient Re-evaluated:Patient Re-evaluated prior to induction Oxygen Delivery Method: Circle system utilized Preoxygenation: Pre-oxygenation with 100% oxygen Induction Type: IV induction Ventilation: Mask ventilation without difficulty Laryngoscope Size: Mac and 4 Grade View: Grade III Tube type: Oral Tube size: 7.5 mm Number of attempts: 2 (1 DL by Resident with no view; 1 DL by CRNA Grade 3 view successful placement) Airway Equipment and Method: Stylet and Oral airway Placement Confirmation: ETT inserted through vocal cords under direct vision, positive ETCO2 and breath sounds checked- equal and bilateral Secured at: 23 cm Tube secured with: Tape Dental Injury: Teeth and Oropharynx as per pre-operative assessment  Difficulty Due To: Difficulty was anticipated, Difficult Airway- due to large tongue and Difficult Airway- due to anterior larynx Future Recommendations: Recommend- induction with short-acting agent, and alternative techniques readily available Comments: 1 DL Attempt by Darnelle Spangle MD with no view; CRNA DL x1 with grade 3 view with cricoid pressure; Successful intubation

## 2023-07-16 NOTE — Progress Notes (Signed)
Patient waiting on ride.1126 1210 arrival of ride  Broadus John, RN

## 2023-07-18 IMAGING — MR MR LUMBAR SPINE W/O CM
5 series · 31 of 48 positions shown · non-contrast
Comparison: Lumbar spine radiographs 06/30/2021. Lumbar MRI
03/13/2011

CLINICAL DATA: Low back pain bilateral radiculopathy

EXAM:
MRI LUMBAR SPINE WITHOUT CONTRAST
TECHNIQUE: Multiplanar, multisequence MR imaging of the lumbar spine was
performed. No intravenous contrast was administered.

[Series 5: T2 · sagittal · 4.0mm · 0.68mm/px · 6 of 16 slices shown (1 of 2)]
[im 1/16]
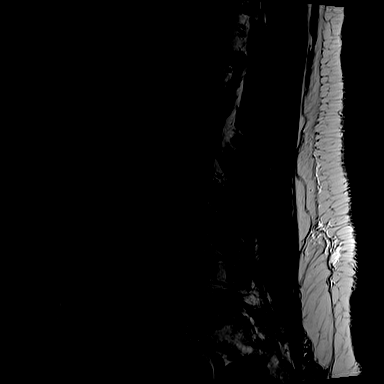
[im 4/16]
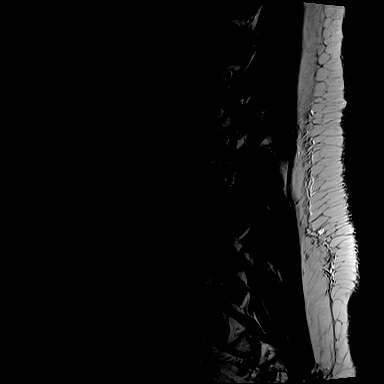
[im 7/16]
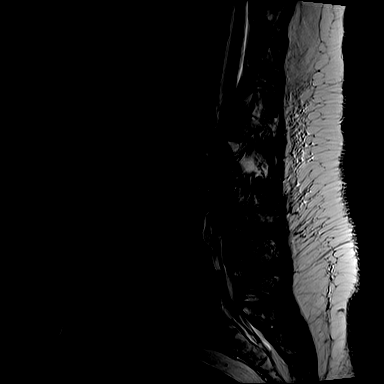
[im 10/16]
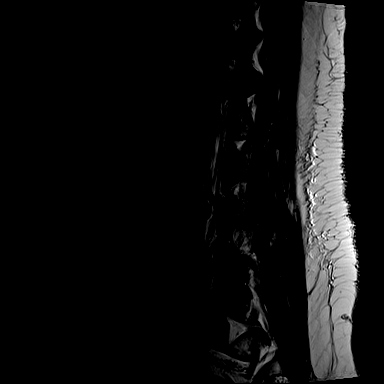
[im 13/16]
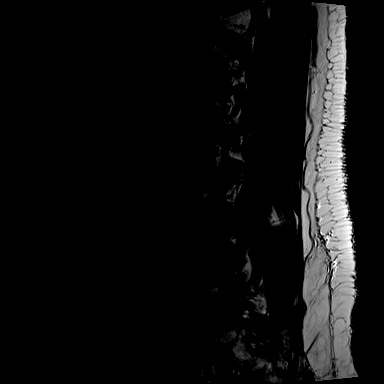
[im 16/16]
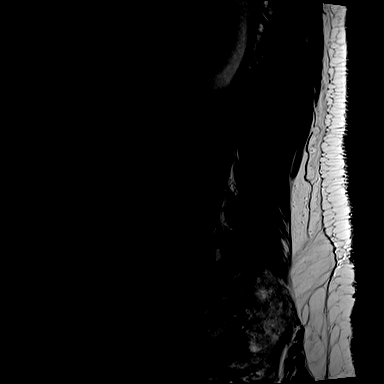

[Series 6: T1 · sagittal · 4.0mm · 0.81mm/px · 7 of 15 slices shown (1 of 2)]
[im 1/15]
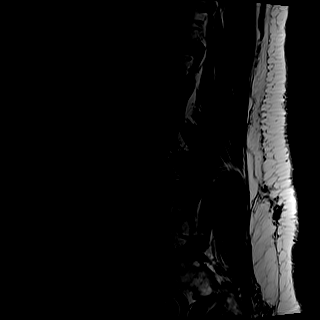
[im 3/15]
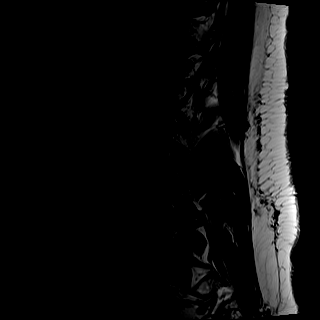
[im 5/15]
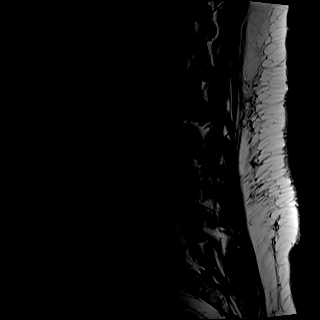
[im 8/15]
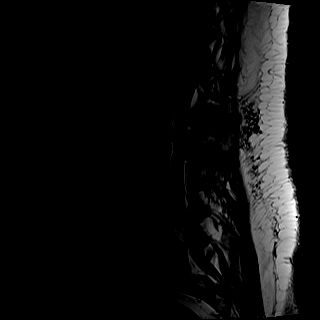
[im 10/15]
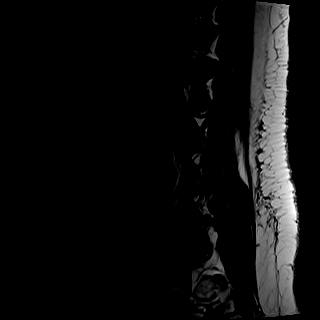
[im 12/15]
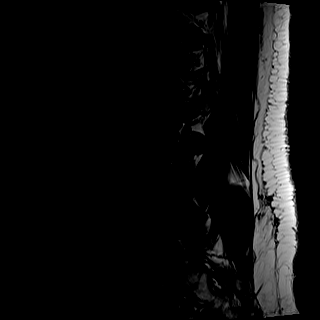
[im 15/15]
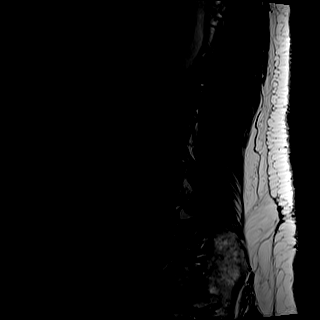

[Series 7: STIR · sagittal · 4.0mm · 0.51mm/px · 2 of 15 slices shown]
[im 1/15]
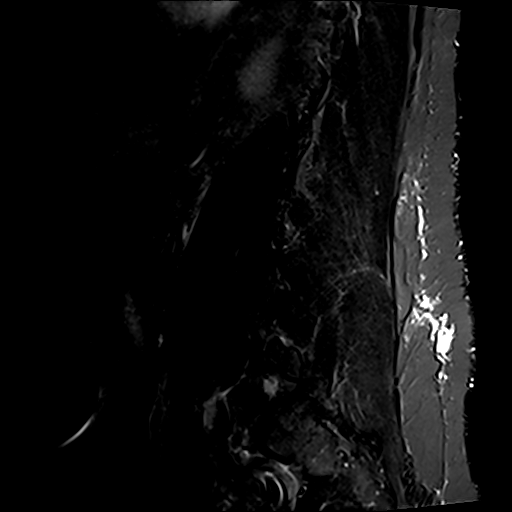
[im 3/15]
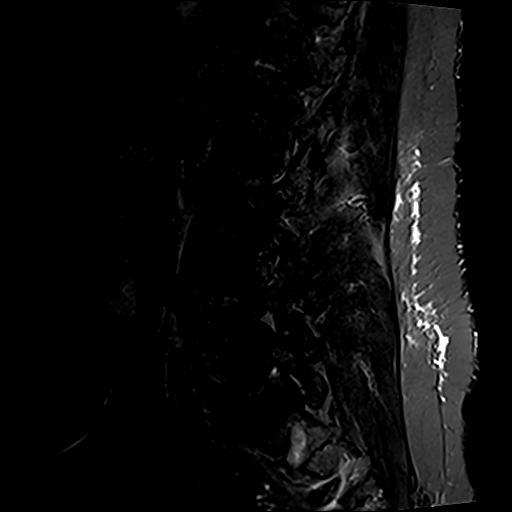

[Series 8: T2 · axial · 4.0mm · 0.70mm/px · z∈[-67,+120]mm · 8 of 32 slices shown (2 of 2)]
[im 1/32]
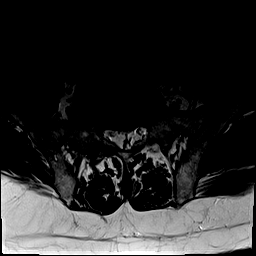
[im 5/32]
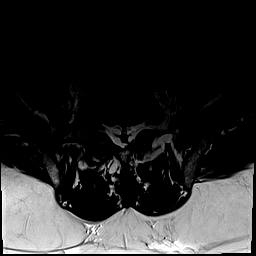
[im 10/32]
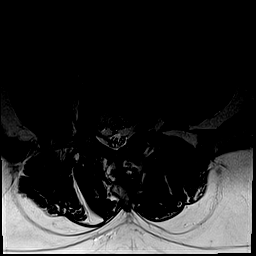
[im 15/32]
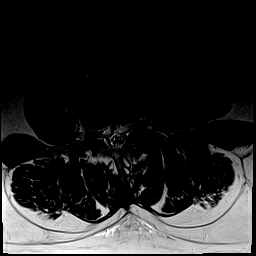
[im 17/32]
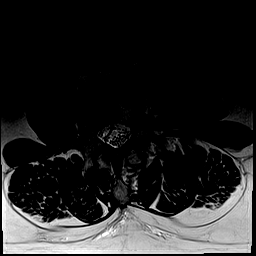
[im 22/32]
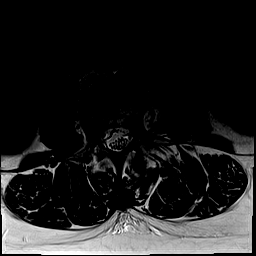
[im 27/32]
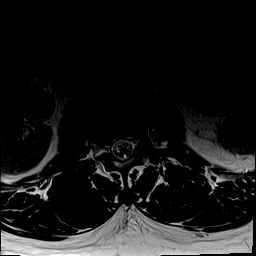
[im 32/32]
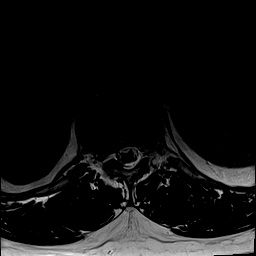

[Series 9: T1 · axial · 4.0mm · 0.35mm/px · z∈[-67,+120]mm · 8 of 32 slices shown (2 of 2)]
[im 1/32]
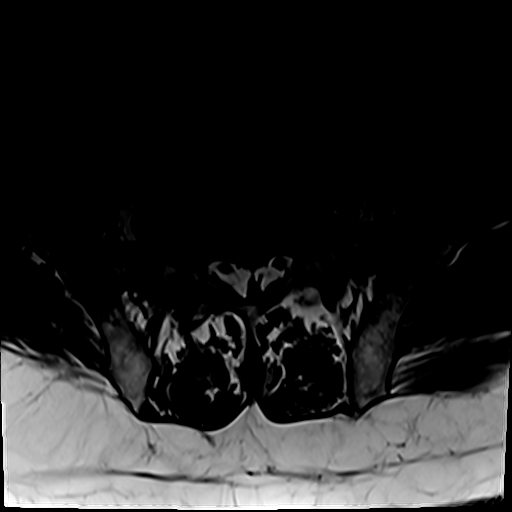
[im 5/32]
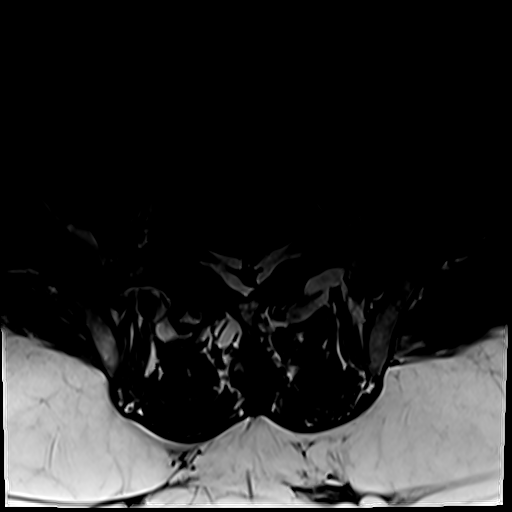
[im 10/32]
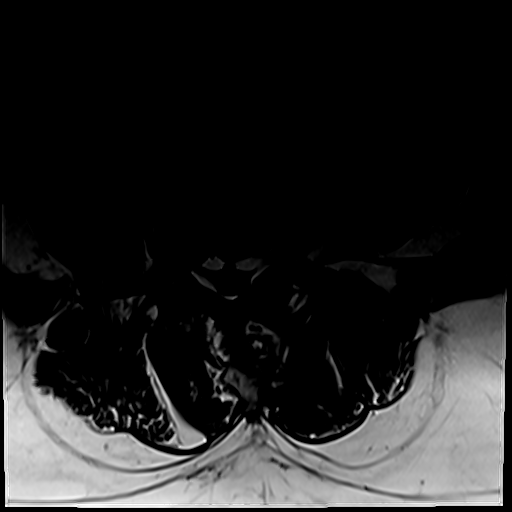
[im 15/32]
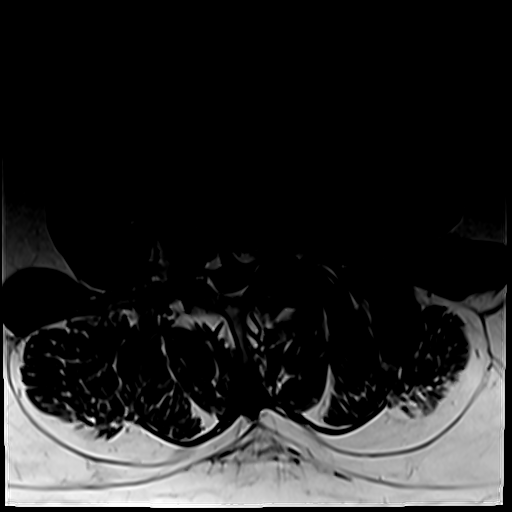
[im 17/32]
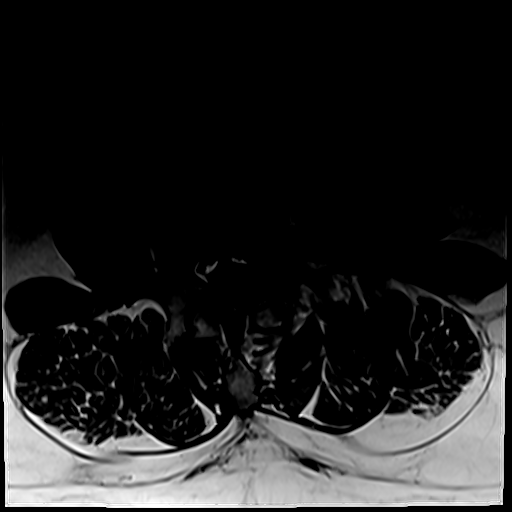
[im 22/32]
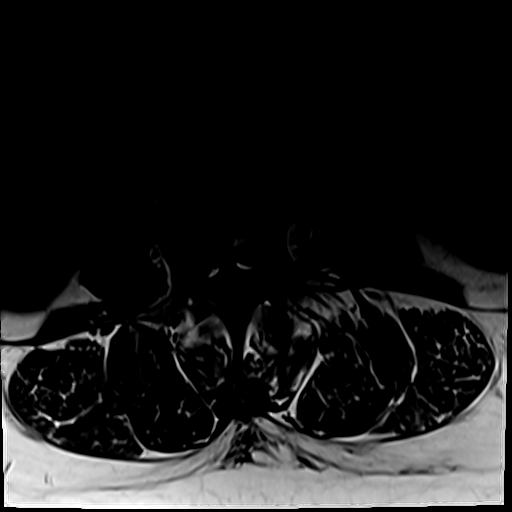
[im 27/32]
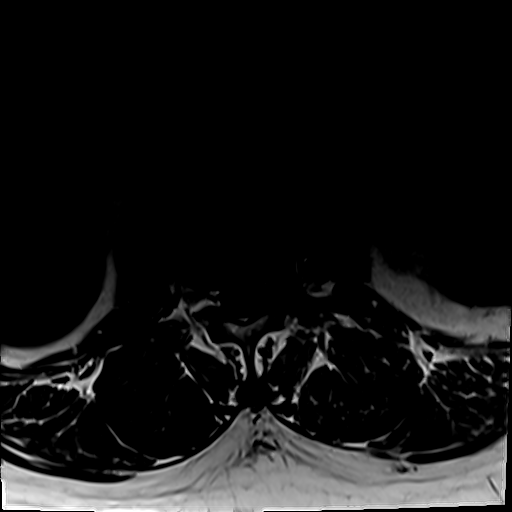
[im 32/32]
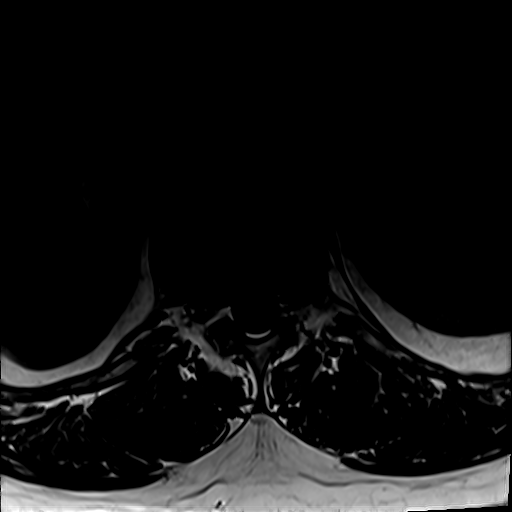

[31 of 48 positions shown; findings below may reference images not displayed]

FINDINGS: Segmentation:  Standard

Alignment:  Mild dextroscoliosis.  Mild retrolisthesis L1-2.

Vertebrae: Disc degeneration with multiple Schmorl's nodes and
endplate changes. No fracture or mass lesion.

Conus medullaris and cauda equina: Conus extends to the L1-2 level.
Conus and cauda equina appear normal.

Paraspinal and other soft tissues: Negative for paraspinous mass,
adenopathy, or fluid collection

Disc levels:

L1-2: Disc degeneration with disc bulging and diffuse endplate
spurring. Mild foraminal narrowing bilaterally

L2-3: Disc degeneration with Schmorl's nodes and diffuse endplate
spurring. Moderate to severe subarticular and foraminal stenosis on
the left. Mild right subarticular stenosis

L3-4: Disc degeneration with Schmorl's node. Disc bulging and
spurring. Moderate to severe left foraminal encroachment. Moderate
left subarticular stenosis. Moderate subarticular and foraminal
stenosis on the right

L4-5: Disc degeneration with Schmorl's node. Diffuse disc bulging
and endplate spurring. Bilateral facet degeneration. Mild spinal
stenosis. Moderate subarticular and foraminal stenosis bilaterally

L5-S1: Disc degeneration with disc bulging and endplate spurring,
right greater than left. Moderate right subarticular and foraminal
stenosis. Mild left subarticular stenosis. Bilateral facet
degeneration.
IMPRESSION: Multilevel spondylosis, with progression since 8378. Negative for
fracture

Multilevel   foraminal stenosis due to spurring.

## 2023-07-19 NOTE — Anesthesia Postprocedure Evaluation (Signed)
Anesthesia Post Note  Patient: CLARENCE COGSWELL  Procedure(s) Performed: XI ROBOT ASSISTED UMBILICAL HERNIA REPAIR W/ MESH (Abdomen)  Patient location during evaluation: Phase II Anesthesia Type: General Level of consciousness: awake Pain management: pain level controlled Vital Signs Assessment: post-procedure vital signs reviewed and stable Respiratory status: spontaneous breathing and respiratory function stable Cardiovascular status: blood pressure returned to baseline and stable Postop Assessment: no headache and no apparent nausea or vomiting Anesthetic complications: yes Comments: Late entry   Encounter Notable Events  Notable Event Outcome Phase Comment  Difficult to intubate - expected  Intraprocedure Filed from anesthesia note documentation.     Last Vitals:  Vitals:   07/16/23 1115 07/16/23 1128  BP: 127/79 (!) 152/76  Pulse: 92 88  Resp: 16 18  Temp:  36.7 C  SpO2: 92% 93%    Last Pain:  Vitals:   07/16/23 1128  TempSrc: Oral  PainSc:                  Windell Norfolk

## 2023-07-29 ENCOUNTER — Ambulatory Visit: Payer: BC Managed Care – PPO | Admitting: Surgery

## 2023-07-29 ENCOUNTER — Encounter: Payer: Self-pay | Admitting: Surgery

## 2023-07-29 ENCOUNTER — Encounter: Payer: Self-pay | Admitting: Family Medicine

## 2023-07-29 VITALS — BP 125/74 | HR 100 | Temp 98.1°F | Resp 14 | Ht 69.0 in | Wt 268.0 lb

## 2023-07-29 DIAGNOSIS — Z09 Encounter for follow-up examination after completed treatment for conditions other than malignant neoplasm: Secondary | ICD-10-CM

## 2023-07-29 NOTE — Progress Notes (Addendum)
[facility] Note   HPI:  67 y.o. Male presents to clinic for post-op follow-up status post robotic assisted laparoscopic umbilical and ventral hernia repair x 2 with mesh on 9/27.  Patient is overall doing well since the surgery.  His pain is well-controlled with just Tylenol.  He is tolerating a diet without nausea and vomiting, and moving his bowels without issue.  His only complaint is some soreness when he is sitting up.  Denies fevers and chills.  Review of Systems:  All other review of systems: otherwise negative   Vital Signs:  BP 125/74   Pulse 100   Temp 98.1 F (36.7 C) (Oral)   Resp 14   Ht 5\' 9"  (1.753 m)   Wt 268 lb (121.6 kg)   SpO2 92%   BMI 39.58 kg/m    Physical Exam:  Physical Exam Vitals reviewed.  Constitutional:      Appearance: Normal appearance.  Abdominal:     Comments: Abdomen soft, nondistended, no percussion tenderness, nontender to palpation; no rigidity, guarding, rebound tenderness; incisions healing well with skin glue in place, umbilicus with palpable fullness at previous hernia location, likely seroma  Neurological:     Mental Status: He is alert.     Laboratory studies: None  Imaging:  None  Assessment:  67 y.o. yo Male who presents for follow-up status post robotic assisted laparoscopic umbilical and ventral hernia repair x 2 with mesh on 9/27.  Plan:  -Patient overall doing well from surgery.  He is tolerating a diet, moving his bowels, and pain is well-controlled -Explained that the fullness at the previous hernia site is likely a seroma.  This will improve with time as his body reabsorbs the fluid -Advised that he needs to take it easy with no lifting greater than 10 pounds for an additional 2 weeks.  He may return to work on 10/25 with no restrictions -Work note provided to the patient -Also advised that he can use antibiotic ointment prior to showering to help loosen the remaining skin glue -Follow up as  needed  All of the above recommendations were discussed with the patient, and all of patient's questions were answered to his expressed satisfaction.  Theophilus Kinds, DO [facility] [address] [place], [place] [address] [phone] (office)

## 2023-07-30 NOTE — Telephone Encounter (Signed)
Voya requested Post-Op notes from 07/29/2023 f/u. Notes sent to [facility] and received confirmation.   Patient was released to return to work on 08/13/2023 without restrictions. Per Dr. Robyne Peers. Letter given to patient at time of f/u ov on 07/29/2023.

## 2023-08-25 ENCOUNTER — Other Ambulatory Visit: Payer: Self-pay | Admitting: Orthopedic Surgery

## 2023-08-25 DIAGNOSIS — M51369 Other intervertebral disc degeneration, lumbar region without mention of lumbar back pain or lower extremity pain: Secondary | ICD-10-CM

## 2023-10-07 ENCOUNTER — Ambulatory Visit: Payer: BC Managed Care – PPO | Admitting: Orthopedic Surgery

## 2023-10-07 ENCOUNTER — Encounter: Payer: Self-pay | Admitting: Orthopedic Surgery

## 2023-10-07 VITALS — Ht 69.0 in | Wt 269.0 lb

## 2023-10-07 DIAGNOSIS — G8929 Other chronic pain: Secondary | ICD-10-CM | POA: Diagnosis not present

## 2023-10-07 DIAGNOSIS — M25511 Pain in right shoulder: Secondary | ICD-10-CM

## 2023-10-07 DIAGNOSIS — M75121 Complete rotator cuff tear or rupture of right shoulder, not specified as traumatic: Secondary | ICD-10-CM | POA: Diagnosis not present

## 2023-10-07 MED ORDER — METHYLPREDNISOLONE ACETATE 40 MG/ML IJ SUSP
40.0000 mg | Freq: Once | INTRAMUSCULAR | Status: AC
Start: 2023-10-07 — End: 2023-10-07
  Administered 2023-10-07: 40 mg via INTRA_ARTICULAR

## 2023-10-07 NOTE — Patient Instructions (Signed)
While we are working on your approval for MRI please go ahead and call to schedule your appointment with [facility] within at least one (1) week.   Central Scheduling [phone]  

## 2023-10-07 NOTE — Progress Notes (Signed)
Patient: Angel Powell           Date of Birth: October 18, 1956           MRN: [id number] Visit Date: 10/07/2023 Requested by: Benetta Spar, MD [address] [place],  [place] [address] PCP: Benetta Spar, MD   Chief Complaint  Patient presents with   Shoulder Pain    Right for 3 to 4 weeks Dr Rexene Edison did surgery years ago    Encounter Diagnoses  Name Primary?   Chronic right shoulder pain    Nontraumatic complete tear of right rotator cuff Yes    Plan: MRI right shoulder to evaluate progression of the previously noted rotator cuff tear      Chief Complaint  Patient presents with   Shoulder Pain    Right for 3 to 4 weeks Dr Rexene Edison did surgery years ago     67 year old male who had a rotator cuff repair in with recurrent rotator cuff tear had an MRI December 12, 2020 and at that time he had physical therapy anti-inflammatory medication and injection and his MRI showed a full-thickness partial width tear anterior supraspinatus high-grade partial-thickness bursal sided tear of the distal subscapularis and partial tear of the infraspinatus biceps tendinosis with partial tear and mild glenohumeral arthritis  He subsequently improved on Indocin and did not seek medical attention until today when he noticed over the last month or 2 that his pain had increased and he is here for evaluation and management   History of Present Illness: I saw Angel Powell back in the office today.  He is now post op 1 right shoulder.   02/15/09 had arthroscopy of the right shoulder, debridement of the glenohumeral joint, debridement of the subscapularis tendon, and repair of the right rotator cuff with push lock anchor and corkscrew anchors, fiberwire.   operative findings included tearing of the rotator cuff supraspinatus and part of the infraspinatus.  He had some mild synovitis of the glenohumeral joint with superior edge tearing of the subscapularis     DATE OF PROCEDURE:  05/04/2008   DATE OF DISCHARGE:                                OPERATIVE REPORT    This is a 67 year old male with a history of right shoulder pain.  Radiographs showed a spur coming from the distal clavicle.  His pain  level was 8 out of 10, he had primarily pain reaching across his chest.  He denied radicular pain or neurologic symptoms.  His rotator cuff  clinically was intact and his impingement sign was unremarkable.    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    PROCEDURE:  Open Mumford/distal clavicle excision  Body mass index is 39.72 kg/m.   Problem list, medical hx, medications and allergies reviewed   Review of Systems  Constitutional:  Negative for chills, fever and weight loss.  Respiratory:  Negative for shortness of breath.   Cardiovascular:  Negative for chest pain.  Neurological:  Negative for tingling.     No Known Allergies  Ht 5\' 9"  (1.753 m)   Wt 269 lb (122 kg)   BMI 39.72 kg/m    Physical exam: Physical Exam Vitals and nursing note reviewed. Exam conducted with a chaperone present.  Constitutional:  General: He is not in acute distress.    Appearance: Normal appearance. He is normal weight. He is not ill-appearing, toxic-appearing or diaphoretic.  HENT:     Head: Normocephalic and atraumatic.  Eyes:     General: No scleral icterus.       Right eye: No discharge.        Left eye: No discharge.     Extraocular Movements: Extraocular movements intact.     Pupils: Pupils are equal, round, and reactive to light.  Cardiovascular:     Rate and Rhythm: Normal rate.     Pulses: Normal pulses.  Pulmonary:     Effort: Pulmonary effort is normal.  Skin:    General: Skin is warm and dry.     Capillary Refill: Capillary refill takes less than 2 seconds.  Neurological:     General: No focal deficit present.      Mental Status: He is alert and oriented to person, place, and time.     Sensory: No sensory deficit.     Gait: Gait normal.  Psychiatric:        Mood and Affect: Mood normal.     Ortho Exam  MSK:  Surgical scars noted over the right shoulder 1 for the distal clavicle 1 for the open cuff repair  The patient has weakness in abduction as well as flexion great 3 and 5 passive range of motion is normal except for painful forward elevation past 120 degrees normal rotation with the at the side Data reviewed:   Image(s) reviewed with personal interpretation:  No new images.  Previous x-ray shows no glenohumeral arthritis on x-ray  MRI report included  IMPRESSION: 1. Evidence of prior rotator cuff repair. Full-thickness, partial width tear of the anterior supraspinatus tendon. Remaining intact tendon is severely thinned. 2. High-grade partial-thickness bursal surface tear of the distal subscapularis tendon at the insertion with proximal intrasubstance extension. 3. Moderate distal infraspinatus tendinosis with small low-grade partial-thickness articular surface tear at the insertion. 4. Moderate biceps tendinosis with partial tear. 5. Mild glenohumeral osteoarthritis.     Electronically Signed   By: Obie Dredge M.D.   On: 12/12/2020 08:57      Result History    Assessment and plan:  Encounter Diagnoses  Name Primary?   Chronic right shoulder pain    Nontraumatic complete tear of right rotator cuff Yes    67 year old male with known rotator cuff recurrent tear did not have surgery 2 years ago comes in with increasing pain and weakness  Recommend MRI right shoulder   Meds ordered this encounter  Medications   methylPREDNISolone acetate (DEPO-MEDROL) injection 40 mg    Procedures:   Right shoulder injection   Verbal consent obtained   TO completed  A steroid injection was performed at right shoulder sas using 1% plain Lidocaine and 40  mg of depomedrol.  This was well tolerated.

## 2023-10-11 ENCOUNTER — Telehealth: Payer: Self-pay | Admitting: Orthopedic Surgery

## 2023-10-11 ENCOUNTER — Telehealth: Payer: Self-pay

## 2023-10-11 NOTE — Telephone Encounter (Signed)
I should be able to work on this on Thursday. If I have time today will do it, but likley will not, in clinic, it is full.

## 2023-10-11 NOTE — Telephone Encounter (Signed)
Melanie at service center left message stating that this patient needs an authorization for his MRI.  She is asking for a return call at [phone] ext# [phone]

## 2023-10-11 NOTE — Telephone Encounter (Signed)
DR. Earleen Newport with the pre service center called left voicemail on 10/08/23 at 1:05 stating the patient needs precert for his MRI.    You can call her back at [phone] ext [phone]

## 2023-10-12 ENCOUNTER — Other Ambulatory Visit (HOSPITAL_COMMUNITY): Payer: BC Managed Care – PPO

## 2023-10-14 ENCOUNTER — Encounter (HOSPITAL_COMMUNITY): Payer: Self-pay | Admitting: Radiology

## 2023-10-14 ENCOUNTER — Ambulatory Visit (HOSPITAL_COMMUNITY)
Admission: RE | Admit: 2023-10-14 | Discharge: 2023-10-14 | Disposition: A | Discharge status: Home / Self Care | Payer: BC Managed Care – PPO | Source: Ambulatory Visit | Attending: Orthopedic Surgery | Admitting: Orthopedic Surgery

## 2023-10-14 DIAGNOSIS — G8929 Other chronic pain: Secondary | ICD-10-CM | POA: Diagnosis present

## 2023-10-14 DIAGNOSIS — M25511 Pain in right shoulder: Secondary | ICD-10-CM | POA: Diagnosis present

## 2023-10-15 ENCOUNTER — Other Ambulatory Visit (HOSPITAL_COMMUNITY)
Admission: RE | Admit: 2023-10-15 | Discharge: 2023-10-15 | Disposition: A | Discharge status: Home / Self Care | Payer: BC Managed Care – PPO | Source: Ambulatory Visit | Attending: Internal Medicine | Admitting: Internal Medicine

## 2023-10-15 DIAGNOSIS — E1169 Type 2 diabetes mellitus with other specified complication: Secondary | ICD-10-CM | POA: Diagnosis not present

## 2023-10-15 DIAGNOSIS — E785 Hyperlipidemia, unspecified: Secondary | ICD-10-CM | POA: Diagnosis not present

## 2023-10-15 DIAGNOSIS — Z0001 Encounter for general adult medical examination with abnormal findings: Secondary | ICD-10-CM | POA: Diagnosis present

## 2023-10-15 LAB — LIPID PANEL
Cholesterol: 202 mg/dL — ABNORMAL HIGH (ref 0–200)
HDL: 63 mg/dL (ref >40)
LDL Cholesterol: 110 mg/dL — ABNORMAL HIGH (ref 0–99)
Total CHOL/HDL Ratio: 3.2 {ratio}
Triglycerides: 143 mg/dL (ref <150)
VLDL: 29 mg/dL (ref 0–40)

## 2023-10-15 LAB — BASIC METABOLIC PANEL
Anion gap: 11 (ref 5–15)
BUN: 23 mg/dL (ref 8–23)
CO2: 23 mmol/L (ref 22–32)
Calcium: 9.1 mg/dL (ref 8.9–10.3)
Chloride: 102 mmol/L (ref 98–111)
Creatinine, Ser: 1.19 mg/dL (ref 0.61–1.24)
GFR, Estimated: >60 mL/min (ref >60)
Glucose, Bld: 153 mg/dL — ABNORMAL HIGH (ref 70–99)
Potassium: 3.8 mmol/L (ref 3.5–5.1)
Sodium: 136 mmol/L (ref 135–145)

## 2023-10-15 LAB — CBC WITH DIFFERENTIAL/PLATELET
Abs Immature Granulocytes: 0.01 10*3/uL (ref 0.00–0.07)
Basophils Absolute: 0 10*3/uL (ref 0.0–0.1)
Basophils Relative: 0 %
Eosinophils Absolute: 0.1 10*3/uL (ref 0.0–0.5)
Eosinophils Relative: 2 %
HCT: 39.8 % (ref 39.0–52.0)
Hemoglobin: 13 g/dL (ref 13.0–17.0)
Immature Granulocytes: 0 %
Lymphocytes Relative: 33 %
Lymphs Abs: 1.8 10*3/uL (ref 0.7–4.0)
MCH: 29 pg (ref 26.0–34.0)
MCHC: 32.7 g/dL (ref 30.0–36.0)
MCV: 88.6 fL (ref 80.0–100.0)
Monocytes Absolute: 0.5 10*3/uL (ref 0.1–1.0)
Monocytes Relative: 9 %
Neutro Abs: 3 10*3/uL (ref 1.7–7.7)
Neutrophils Relative %: 56 %
Platelets: 164 10*3/uL (ref 150–400)
RBC: 4.49 MIL/uL (ref 4.22–5.81)
RDW: 15 % (ref 11.5–15.5)
WBC: 5.4 10*3/uL (ref 4.0–10.5)
nRBC: 0 % (ref 0.0–0.2)

## 2023-10-15 LAB — HEPATIC FUNCTION PANEL
ALT: 41 U/L (ref 0–44)
AST: 28 U/L (ref 15–41)
Albumin: 3.8 g/dL (ref 3.5–5.0)
Alkaline Phosphatase: 83 U/L (ref 38–126)
Bilirubin, Direct: 0.1 mg/dL (ref 0.0–0.2)
Indirect Bilirubin: 0.3 mg/dL (ref 0.3–0.9)
Total Bilirubin: 0.4 mg/dL (ref <1.2)
Total Protein: 7.8 g/dL (ref 6.5–8.1)

## 2023-10-16 LAB — MICROALBUMIN / CREATININE URINE RATIO
Creatinine, Urine: 149.1 mg/dL
Microalb Creat Ratio: 5 mg/g{creat} (ref 0–29)
Microalb, Ur: 7.2 ug/mL — ABNORMAL HIGH

## 2023-10-16 LAB — HEMOGLOBIN A1C
Hgb A1c MFr Bld: 7.2 % — ABNORMAL HIGH (ref 4.8–5.6)
Mean Plasma Glucose: 160 mg/dL

## 2023-11-14 ENCOUNTER — Emergency Department (HOSPITAL_COMMUNITY): Payer: BC Managed Care – PPO

## 2023-11-14 ENCOUNTER — Other Ambulatory Visit: Payer: Self-pay

## 2023-11-14 ENCOUNTER — Inpatient Hospital Stay (HOSPITAL_COMMUNITY)
Admission: EM | Admit: 2023-11-14 | Discharge: 2023-11-16 | DRG: 190 | Disposition: A | Discharge status: Home / Self Care | Payer: BC Managed Care – PPO | Attending: Family Medicine | Admitting: Family Medicine

## 2023-11-14 ENCOUNTER — Encounter (HOSPITAL_COMMUNITY): Payer: Self-pay

## 2023-11-14 DIAGNOSIS — Z6841 Body Mass Index (BMI) 40.0 and over, adult: Secondary | ICD-10-CM

## 2023-11-14 DIAGNOSIS — D696 Thrombocytopenia, unspecified: Secondary | ICD-10-CM | POA: Diagnosis present

## 2023-11-14 DIAGNOSIS — E1165 Type 2 diabetes mellitus with hyperglycemia: Secondary | ICD-10-CM | POA: Diagnosis present

## 2023-11-14 DIAGNOSIS — Z79899 Other long term (current) drug therapy: Secondary | ICD-10-CM | POA: Diagnosis not present

## 2023-11-14 DIAGNOSIS — E66813 Obesity, class 3: Secondary | ICD-10-CM | POA: Diagnosis present

## 2023-11-14 DIAGNOSIS — J441 Chronic obstructive pulmonary disease with (acute) exacerbation: Principal | ICD-10-CM

## 2023-11-14 DIAGNOSIS — K219 Gastro-esophageal reflux disease without esophagitis: Secondary | ICD-10-CM | POA: Diagnosis present

## 2023-11-14 DIAGNOSIS — E7800 Pure hypercholesterolemia, unspecified: Secondary | ICD-10-CM | POA: Diagnosis present

## 2023-11-14 DIAGNOSIS — Z7984 Long term (current) use of oral hypoglycemic drugs: Secondary | ICD-10-CM

## 2023-11-14 DIAGNOSIS — Z87891 Personal history of nicotine dependence: Secondary | ICD-10-CM

## 2023-11-14 DIAGNOSIS — N400 Enlarged prostate without lower urinary tract symptoms: Secondary | ICD-10-CM | POA: Diagnosis present

## 2023-11-14 DIAGNOSIS — J9601 Acute respiratory failure with hypoxia: Secondary | ICD-10-CM | POA: Diagnosis present

## 2023-11-14 DIAGNOSIS — D649 Anemia, unspecified: Secondary | ICD-10-CM | POA: Diagnosis present

## 2023-11-14 DIAGNOSIS — Z1152 Encounter for screening for COVID-19: Secondary | ICD-10-CM | POA: Diagnosis not present

## 2023-11-14 DIAGNOSIS — K760 Fatty (change of) liver, not elsewhere classified: Secondary | ICD-10-CM | POA: Diagnosis present

## 2023-11-14 DIAGNOSIS — R0609 Other forms of dyspnea: Secondary | ICD-10-CM | POA: Diagnosis not present

## 2023-11-14 LAB — BASIC METABOLIC PANEL
Anion gap: 13 (ref 5–15)
BUN: 15 mg/dL (ref 8–23)
CO2: 24 mmol/L (ref 22–32)
Calcium: 8.6 mg/dL — ABNORMAL LOW (ref 8.9–10.3)
Chloride: 103 mmol/L (ref 98–111)
Creatinine, Ser: 0.88 mg/dL (ref 0.61–1.24)
GFR, Estimated: >60 mL/min (ref >60)
Glucose, Bld: 91 mg/dL (ref 70–99)
Potassium: 3.3 mmol/L — ABNORMAL LOW (ref 3.5–5.1)
Sodium: 140 mmol/L (ref 135–145)

## 2023-11-14 LAB — CBC
HCT: 38.3 % — ABNORMAL LOW (ref 39.0–52.0)
Hemoglobin: 12.4 g/dL — ABNORMAL LOW (ref 13.0–17.0)
MCH: 28.7 pg (ref 26.0–34.0)
MCHC: 32.4 g/dL (ref 30.0–36.0)
MCV: 88.7 fL (ref 80.0–100.0)
Platelets: 146 10*3/uL — ABNORMAL LOW (ref 150–400)
RBC: 4.32 MIL/uL (ref 4.22–5.81)
RDW: 15.8 % — ABNORMAL HIGH (ref 11.5–15.5)
WBC: 5.2 10*3/uL (ref 4.0–10.5)
nRBC: 0 % (ref 0.0–0.2)

## 2023-11-14 LAB — D-DIMER, QUANTITATIVE: D-Dimer, Quant: 1.54 ug{FEU}/mL — ABNORMAL HIGH (ref 0.00–0.50)

## 2023-11-14 LAB — RESP PANEL BY RT-PCR (RSV, FLU A&B, COVID)  RVPGX2
Influenza A by PCR: NEGATIVE
Influenza B by PCR: NEGATIVE
Resp Syncytial Virus by PCR: NEGATIVE
SARS Coronavirus 2 by RT PCR: NEGATIVE

## 2023-11-14 LAB — TROPONIN I (HIGH SENSITIVITY)
Troponin I (High Sensitivity): 6 ng/L (ref <18)
Troponin I (High Sensitivity): 6 ng/L (ref <18)

## 2023-11-14 LAB — GLUCOSE, CAPILLARY
Glucose-Capillary: 112 mg/dL — ABNORMAL HIGH (ref 70–99)
Glucose-Capillary: 162 mg/dL — ABNORMAL HIGH (ref 70–99)

## 2023-11-14 MED ORDER — BISACODYL 10 MG RE SUPP: 10.0000 mg | Freq: Every day | RECTAL | Status: DC | PRN

## 2023-11-14 MED ORDER — IOHEXOL 350 MG/ML SOLN
75.0000 mL | Freq: Once | INTRAVENOUS | Status: AC | PRN
  Administered 2023-11-14: 75 mL via INTRAVENOUS

## 2023-11-14 MED ORDER — POLYETHYLENE GLYCOL 3350 17 G PO PACK: 17.0000 g | PACK | Freq: Every day | ORAL | Status: DC | PRN

## 2023-11-14 MED ORDER — TAMSULOSIN HCL 0.4 MG PO CAPS
0.4000 mg | ORAL_CAPSULE | Freq: Every day | ORAL | Status: DC
  Administered 2023-11-15 – 2023-11-16 (×2): 0.4 mg via ORAL
  Filled 2023-11-14 (×2): qty 1

## 2023-11-14 MED ORDER — INSULIN ASPART 100 UNIT/ML IJ SOLN
0.0000 [IU] | Freq: Every day | INTRAMUSCULAR | Status: DC
  Administered 2023-11-15: 2 [IU] via SUBCUTANEOUS

## 2023-11-14 MED ORDER — HEPARIN SODIUM (PORCINE) 5000 UNIT/ML IJ SOLN
5000.0000 [IU] | Freq: Three times a day (TID) | INTRAMUSCULAR | Status: DC
  Administered 2023-11-14 – 2023-11-16 (×5): 5000 [IU] via SUBCUTANEOUS
  Filled 2023-11-14 (×5): qty 1

## 2023-11-14 MED ORDER — TRAZODONE HCL 50 MG PO TABS
50.0000 mg | ORAL_TABLET | Freq: Every evening | ORAL | Status: DC | PRN
  Filled 2023-11-14: qty 1

## 2023-11-14 MED ORDER — IPRATROPIUM-ALBUTEROL 0.5-2.5 (3) MG/3ML IN SOLN
3.0000 mL | Freq: Once | RESPIRATORY_TRACT | Status: AC
  Administered 2023-11-14: 3 mL via RESPIRATORY_TRACT
  Filled 2023-11-14: qty 3

## 2023-11-14 MED ORDER — ONDANSETRON HCL 4 MG PO TABS: 4.0000 mg | ORAL_TABLET | Freq: Four times a day (QID) | ORAL | Status: DC | PRN

## 2023-11-14 MED ORDER — TIZANIDINE HCL 4 MG PO TABS
4.0000 mg | ORAL_TABLET | Freq: Every day | ORAL | Status: DC
  Administered 2023-11-15 – 2023-11-16 (×2): 4 mg via ORAL
  Filled 2023-11-14 (×2): qty 1

## 2023-11-14 MED ORDER — METHYLPREDNISOLONE SODIUM SUCC 40 MG IJ SOLR
40.0000 mg | Freq: Two times a day (BID) | INTRAMUSCULAR | Status: DC
  Administered 2023-11-14 – 2023-11-16 (×4): 40 mg via INTRAVENOUS
  Filled 2023-11-14 (×4): qty 1

## 2023-11-14 MED ORDER — ONDANSETRON HCL 4 MG/2ML IJ SOLN: 4.0000 mg | Freq: Four times a day (QID) | INTRAMUSCULAR | Status: DC | PRN

## 2023-11-14 MED ORDER — SODIUM CHLORIDE 0.9% FLUSH
3.0000 mL | Freq: Two times a day (BID) | INTRAVENOUS | Status: DC
  Administered 2023-11-14 – 2023-11-15 (×2): 3 mL via INTRAVENOUS

## 2023-11-14 MED ORDER — SODIUM CHLORIDE 0.9% FLUSH: 3.0000 mL | INTRAVENOUS | Status: DC | PRN

## 2023-11-14 MED ORDER — SODIUM CHLORIDE 0.9% FLUSH
3.0000 mL | Freq: Two times a day (BID) | INTRAVENOUS | Status: DC
  Administered 2023-11-14 – 2023-11-15 (×4): 3 mL via INTRAVENOUS

## 2023-11-14 MED ORDER — METHYLPREDNISOLONE SODIUM SUCC 125 MG IJ SOLR
125.0000 mg | Freq: Once | INTRAMUSCULAR | Status: AC
  Administered 2023-11-14: 125 mg via INTRAVENOUS
  Filled 2023-11-14: qty 2

## 2023-11-14 MED ORDER — HYDRALAZINE HCL 20 MG/ML IJ SOLN: 10.0000 mg | Freq: Four times a day (QID) | INTRAMUSCULAR | Status: DC | PRN

## 2023-11-14 MED ORDER — ALBUTEROL SULFATE HFA 108 (90 BASE) MCG/ACT IN AERS: 2.0000 | INHALATION_SPRAY | RESPIRATORY_TRACT | Status: DC | PRN

## 2023-11-14 MED ORDER — ACETAMINOPHEN 325 MG PO TABS
650.0000 mg | ORAL_TABLET | Freq: Four times a day (QID) | ORAL | Status: DC | PRN
  Administered 2023-11-14: 650 mg via ORAL
  Filled 2023-11-14: qty 2

## 2023-11-14 MED ORDER — DOXYCYCLINE HYCLATE 100 MG PO TABS
100.0000 mg | ORAL_TABLET | Freq: Two times a day (BID) | ORAL | Status: DC
  Administered 2023-11-14 – 2023-11-16 (×4): 100 mg via ORAL
  Filled 2023-11-14 (×4): qty 1

## 2023-11-14 MED ORDER — PANTOPRAZOLE SODIUM 40 MG PO TBEC
40.0000 mg | DELAYED_RELEASE_TABLET | Freq: Every day | ORAL | Status: DC
  Administered 2023-11-15 – 2023-11-16 (×2): 40 mg via ORAL
  Filled 2023-11-14 (×2): qty 1

## 2023-11-14 MED ORDER — ALBUTEROL SULFATE (2.5 MG/3ML) 0.083% IN NEBU: 2.5000 mg | INHALATION_SOLUTION | RESPIRATORY_TRACT | Status: DC | PRN

## 2023-11-14 MED ORDER — POLYVINYL ALCOHOL 1.4 % OP SOLN: 1.0000 [drp] | Freq: Two times a day (BID) | OPHTHALMIC | Status: DC | PRN

## 2023-11-14 MED ORDER — POTASSIUM CHLORIDE CRYS ER 20 MEQ PO TBCR
40.0000 meq | EXTENDED_RELEASE_TABLET | ORAL | Status: AC
  Administered 2023-11-14 (×2): 40 meq via ORAL
  Filled 2023-11-14 (×2): qty 2

## 2023-11-14 MED ORDER — INSULIN ASPART 100 UNIT/ML IJ SOLN
3.0000 [IU] | Freq: Three times a day (TID) | INTRAMUSCULAR | Status: DC
  Administered 2023-11-15 – 2023-11-16 (×4): 3 [IU] via SUBCUTANEOUS

## 2023-11-14 MED ORDER — IPRATROPIUM-ALBUTEROL 0.5-2.5 (3) MG/3ML IN SOLN
3.0000 mL | Freq: Four times a day (QID) | RESPIRATORY_TRACT | Status: DC
  Administered 2023-11-14 – 2023-11-15 (×4): 3 mL via RESPIRATORY_TRACT
  Filled 2023-11-14 (×3): qty 3

## 2023-11-14 MED ORDER — ACETAMINOPHEN 650 MG RE SUPP: 650.0000 mg | Freq: Four times a day (QID) | RECTAL | Status: DC | PRN

## 2023-11-14 MED ORDER — INSULIN ASPART 100 UNIT/ML IJ SOLN
0.0000 [IU] | Freq: Three times a day (TID) | INTRAMUSCULAR | Status: DC
  Administered 2023-11-15: 3 [IU] via SUBCUTANEOUS
  Administered 2023-11-15: 8 [IU] via SUBCUTANEOUS
  Administered 2023-11-15: 5 [IU] via SUBCUTANEOUS
  Administered 2023-11-16: 2 [IU] via SUBCUTANEOUS

## 2023-11-14 MED ORDER — DM-GUAIFENESIN ER 30-600 MG PO TB12
1.0000 | ORAL_TABLET | Freq: Two times a day (BID) | ORAL | Status: DC
  Administered 2023-11-14 – 2023-11-16 (×4): 1 via ORAL
  Filled 2023-11-14 (×4): qty 1

## 2023-11-14 MED ORDER — SODIUM CHLORIDE 0.9 % IV SOLN: INTRAVENOUS | Status: AC | PRN

## 2023-11-14 MED ORDER — SENNOSIDES-DOCUSATE SODIUM 8.6-50 MG PO TABS
2.0000 | ORAL_TABLET | Freq: Two times a day (BID) | ORAL | Status: DC
  Administered 2023-11-14 – 2023-11-16 (×4): 2 via ORAL
  Filled 2023-11-14 (×4): qty 2

## 2023-11-14 MED ORDER — ATORVASTATIN CALCIUM 10 MG PO TABS
20.0000 mg | ORAL_TABLET | Freq: Every day | ORAL | Status: DC
  Administered 2023-11-15 – 2023-11-16 (×2): 20 mg via ORAL
  Filled 2023-11-14 (×2): qty 2

## 2023-11-14 NOTE — Plan of Care (Signed)
Problem: Nutritional: Goal: Maintenance of adequate nutrition will improve Outcome: Progressing

## 2023-11-14 NOTE — H&P (Signed)
Patient Demographics:    Angel Powell, is a 68 y.o. male  MRN: [id number]   DOB - 1955-11-27  Admit Date - 11/14/2023  Outpatient Primary MD for the patient is Fanta, Wayland Salinas, MD   Assessment & Plan:   Assessment and Plan:  1)Acute COPD Exacerbation-failed outpatient treatment with steroids antibiotics by urgent care facility --COVID, flu and RSV negative  no definite pneumonia,  treat empirically with IV Solu-Medrol   give mucolytics, doxycycline and bronchodilators as ordered, supplemental oxygen as ordered.    2)Acute hypoxic respiratory failure--due to #1 above -Manage as above #1 -Currently requiring 2 L of oxygen by nasal cannula  3) anemia and thrombocytopenia--this appears not to be new -Patient has hepatic steatosis -No bleeding concerns -Monitor closely  4)DM2--- recent A1c 7.2 reflecting uncontrolled diabetes with hyperglycemia PTA -Anticipate worsening hyperglycemic control with steroids -Hold metformin given contrast study with CTA -Use Novolog/Humalog Sliding scale insulin with Accu-Cheks/Fingersticks as ordered   5)Other--CTA chest today with findings suggestive of  possible pulmonary hypertension as well as hepatic steatosis and CAD -- Outpatient follow-up with PCP for further management advised  6)GERD--continue PPI especially while on steroids  7)BPH--continue Flomax  8)HLD--continue Lipitor  Remains inpatient appropriate because: failed outpatient Treatment  Dispo: The patient is from: Home              Anticipated d/c is to: Home              Anticipated d/c date is: 2 days              Patient currently is not medically stable to d/c. Barriers: Not Clinically Stable-   With History of - Reviewed by me  Past Medical History:  Diagnosis Date   GERD (gastroesophageal  reflux disease)    Hypercholesteremia       Past Surgical History:  Procedure Laterality Date   bone spur     right   COLONOSCOPY  02/10/2007   Small internal hemorrhoids.  Otherwise normal colon.  Repeat in 10 years.   COLONOSCOPY WITH PROPOFOL N/A 07/25/2020   Procedure: COLONOSCOPY WITH PROPOFOL;  Surgeon: Corbin Ade, MD;  Location: [facility];  Service: Endoscopy;  Laterality: N/A;  7:30am   HERNIA REPAIR     Abdomen   ROTATOR CUFF REPAIR     left, [facility]; [place]   ROTATOR CUFF REPAIR Right     Chief Complaint  Patient presents with   Shortness of Breath      HPI:    Angel Powell  is a 68 y.o. male reformed smoker  (quit smoking about 10 years ago ) with past medical history relevant for BPH, HLD, GERD, DM2 and COPD/emphysema presents to the ED with worsening shortness of breath and cough x 2 weeks -He is vaccinated against the flu -Patient states cough is productive with thick green sputum -Rhinorrhea is worsened -He was started on prednisone and azithromycin a couple of days ago  by urgent care provider -In the ED patient with expiratory wheezes and increased work of breathing O2 sats down to the mid 80s required 2 L of oxygen via nasal cannula -Chest x-ray without acute findings -D-dimer elevated at 1.54, CTA chest negative for PE patient does have emphysema with diffuse bilateral bronchial wall thickening and evidence of possible pulmonary hypertension as well as hepatic steatosis and CAD -COVID, flu and RSV negative Troponin is 6,  repeat troponin is 6, EKG is  sinus rhythm -Potassium is 3.3, creatinine 0.88 -WBC is 5.2, hemoglobin 12.4, platelets 146   Review of systems:    In addition to the HPI above,   A full Review of  Systems was done, all other systems reviewed are negative except as noted above in HPI , .   Social History:  Reviewed by me    Social History   Tobacco Use   Smoking status: Former    Current packs/day: 0.00    Average  packs/day: 1 pack/day for 0.5 years (0.5 ttl pk-yrs)    Types: Cigarettes    Start date: 08/19/2019    Quit date: 02/17/2020    Years since quitting: 3.7   Smokeless tobacco: Never  Substance Use Topics   Alcohol use: Not Currently    Comment: 2-3 beer every other day and a shot of liquor every other day; 05/13/20 "I quit"- last drank in June 2021     Family History :  Reviewed by me    Family History  Problem Relation Age of Onset   Colon cancer Neg Hx     Home Medications:   Prior to Admission medications   Medication Sig Start Date End Date Taking? Authorizing Provider  albuterol (VENTOLIN HFA) 108 (90 Base) MCG/ACT inhaler Inhale 2 puffs into the lungs every 4 (four) hours as needed. 01/29/23   [provider]  atorvastatin (LIPITOR) 20 MG tablet Take 20 mg by mouth daily.    [provider]  Brimonidine Tartrate (LUMIFY) 0.025 % SOLN Place 1-2 drops into both eyes 2 (two) times daily as needed (irritated/dry eyes).    [provider]  Cholecalciferol (VITAMIN D3) 50 MCG (2000 UT) capsule Take 2,000 Units by mouth daily. 05/25/23   [provider]  docusate sodium (COLACE) 100 MG capsule Take 1 capsule (100 mg total) by mouth 2 (two) times daily. 07/16/23 07/15/24  Pappayliou, Santina Evans A, DO  metFORMIN (GLUCOPHAGE) 500 MG tablet Take 1 tablet (500 mg total) by mouth 2 (two) times daily with a meal. Patient taking differently: Take 500 mg by mouth 2 (two) times daily as needed (elevated blood sugar). 02/06/22 07/08/24  Eber Hong, MD  omeprazole (PRILOSEC) 40 MG capsule Take 40 mg by mouth daily. 07/06/20   [provider]  oxyCODONE (ROXICODONE) 5 MG immediate release tablet Take 1 tablet (5 mg total) by mouth every 6 (six) hours as needed. Patient not taking: Reported on 10/07/2023 07/16/23   Pappayliou, Santina Evans A, DO  tamsulosin (FLOMAX) 0.4 MG CAPS capsule Take 0.4 mg by mouth daily. 10/13/21   [provider]  tiZANidine  (ZANAFLEX) 4 MG tablet TAKE 1 TABLET BY MOUTH EVERY DAY 08/25/23   Vickki Hearing, MD     Allergies:    No Known Allergies   Physical Exam:   Vitals  Blood pressure (!) 142/90, pulse 90, temperature 98 F (36.7 C), temperature source Oral, resp. rate 18, height 5\' 8"  (1.727 m), weight 119.7 kg, SpO2 94%.  Physical Examination: General appearance -  alert, dyspnea on exertion persist Mental status - alert, oriented to person, place, and time,  Eyes - sclera anicteric Nose- [place] 2L/min Neck - supple, no JVD elevation , Chest - -diminished breath sounds with scattered wheezes bilaterally Heart - S1 and S2 normal, regular  Abdomen - soft, nontender, nondistended, +BS Neurological - screening mental status exam normal, neck supple without rigidity, cranial nerves II through XII intact, DTR's normal and symmetric Extremities - no pedal edema noted, intact peripheral pulses  Skin - warm, dry    Data Review:    CBC Recent Labs  Lab 11/14/23 1034  WBC 5.2  HGB 12.4*  HCT 38.3*  PLT 146*  MCV 88.7  MCH 28.7  MCHC 32.4  RDW 15.8*   ------------------------------------------------------------------------------------------------------------------  Chemistries  Recent Labs  Lab 11/14/23 1034  NA 140  K 3.3*  CL 103  CO2 24  GLUCOSE 91  BUN 15  CREATININE 0.88  CALCIUM 8.6*   ------------------------------------------------------------------------------------------------------------------ estimated creatinine clearance is 102.5 mL/min (by C-G formula based on SCr of 0.88 mg/dL). ------------------------------------------------------------------------------------------------------------------ - Recent Labs    11/14/23 1034  DDIMER 1.54*   ------------------------------------------------------------------------------------------------------------------ Urinalysis    Component Value Date/Time   COLORURINE AMBER (A) 02/06/2022 1433   APPEARANCEUR CLEAR 02/06/2022  1433   APPEARANCEUR Clear 09/10/2021 0948   LABSPEC 1.023 02/06/2022 1433   PHURINE 6.0 02/06/2022 1433   GLUCOSEU >=500 (A) 02/06/2022 1433   HGBUR NEGATIVE 02/06/2022 1433   BILIRUBINUR NEGATIVE 02/06/2022 1433   BILIRUBINUR Negative 09/10/2021 0948   KETONESUR NEGATIVE 02/06/2022 1433   PROTEINUR NEGATIVE 02/06/2022 1433   UROBILINOGEN 0.2 01/02/2012 1025   NITRITE NEGATIVE 02/06/2022 1433   LEUKOCYTESUR SMALL (A) 02/06/2022 1433     Imaging Results:    CT Angio Chest PE W and/or Wo Contrast Result Date: 11/14/2023 CLINICAL DATA:  Shortness of breath, congestion EXAM: CT ANGIOGRAPHY CHEST WITH CONTRAST TECHNIQUE: Multidetector CT imaging of the chest was performed using the standard protocol during bolus administration of intravenous contrast. Multiplanar CT image reconstructions and MIPs were obtained to evaluate the vascular anatomy. RADIATION DOSE REDUCTION: This exam was performed according to the departmental dose-optimization program which includes automated exposure control, adjustment of the mA and/or kV according to patient size and/or use of iterative reconstruction technique. CONTRAST:  75mL OMNIPAQUE IOHEXOL 350 MG/ML SOLN COMPARISON:  None Available. FINDINGS: Cardiovascular: Satisfactory opacification of the pulmonary arteries to the segmental level. No evidence of pulmonary embolism. Normal heart size. Three-vessel coronary artery calcifications. Enlargement of the main pulmonary artery measuring up to 4.0 cm in caliber. No pericardial effusion. Mediastinum/Nodes: No enlarged mediastinal, hilar, or axillary lymph nodes. Thyroid gland, trachea, and esophagus demonstrate no significant findings. Lungs/Pleura: Mild paraseptal emphysema. Diffuse bilateral bronchial wall thickening. Bibasilar scarring or atelectasis. No pleural effusion or pneumothorax. Upper Abdomen: No acute abnormality.  Hepatic steatosis. Musculoskeletal: Bilateral gynecomastia.  No acute osseous findings. Review  of the MIP images confirms the above findings. IMPRESSION: 1. Negative examination for pulmonary embolism. 2. Emphysema and diffuse bilateral bronchial wall thickening. 3. Enlargement of the main pulmonary artery, as can be seen in pulmonary hypertension. 4. Coronary artery disease. 5. Hepatic steatosis. Emphysema (ICD10-J43.9). Electronically Signed   By: Jearld Lesch M.D.   On: 11/14/2023 13:28   DG Chest 2 View Result Date: 11/14/2023 CLINICAL DATA:  Shortness of breath for several days. Productive cough. EXAM: CHEST - 2 VIEW COMPARISON:  11/10/2023 FINDINGS: The heart size and mediastinal contours are within normal limits. Both lungs are clear. The  visualized skeletal structures are unremarkable. IMPRESSION: No active cardiopulmonary disease. Electronically Signed   By: Danae Orleans M.D.   On: 11/14/2023 10:40    Radiological Exams on Admission: CT Angio Chest PE W and/or Wo Contrast Result Date: 11/14/2023 CLINICAL DATA:  Shortness of breath, congestion EXAM: CT ANGIOGRAPHY CHEST WITH CONTRAST TECHNIQUE: Multidetector CT imaging of the chest was performed using the standard protocol during bolus administration of intravenous contrast. Multiplanar CT image reconstructions and MIPs were obtained to evaluate the vascular anatomy. RADIATION DOSE REDUCTION: This exam was performed according to the departmental dose-optimization program which includes automated exposure control, adjustment of the mA and/or kV according to patient size and/or use of iterative reconstruction technique. CONTRAST:  75mL OMNIPAQUE IOHEXOL 350 MG/ML SOLN COMPARISON:  None Available. FINDINGS: Cardiovascular: Satisfactory opacification of the pulmonary arteries to the segmental level. No evidence of pulmonary embolism. Normal heart size. Three-vessel coronary artery calcifications. Enlargement of the main pulmonary artery measuring up to 4.0 cm in caliber. No pericardial effusion. Mediastinum/Nodes: No enlarged mediastinal, hilar,  or axillary lymph nodes. Thyroid gland, trachea, and esophagus demonstrate no significant findings. Lungs/Pleura: Mild paraseptal emphysema. Diffuse bilateral bronchial wall thickening. Bibasilar scarring or atelectasis. No pleural effusion or pneumothorax. Upper Abdomen: No acute abnormality.  Hepatic steatosis. Musculoskeletal: Bilateral gynecomastia.  No acute osseous findings. Review of the MIP images confirms the above findings. IMPRESSION: 1. Negative examination for pulmonary embolism. 2. Emphysema and diffuse bilateral bronchial wall thickening. 3. Enlargement of the main pulmonary artery, as can be seen in pulmonary hypertension. 4. Coronary artery disease. 5. Hepatic steatosis. Emphysema (ICD10-J43.9). Electronically Signed   By: Jearld Lesch M.D.   On: 11/14/2023 13:28   DG Chest 2 View Result Date: 11/14/2023 CLINICAL DATA:  Shortness of breath for several days. Productive cough. EXAM: CHEST - 2 VIEW COMPARISON:  11/10/2023 FINDINGS: The heart size and mediastinal contours are within normal limits. Both lungs are clear. The visualized skeletal structures are unremarkable. IMPRESSION: No active cardiopulmonary disease. Electronically Signed   By: Danae Orleans M.D.   On: 11/14/2023 10:40   DVT Prophylaxis -SCD/Heparin  AM Labs Ordered, also please review Full Orders  Family Communication: Admission, patients condition and plan of care including tests being ordered have been discussed with the patient  who indicate understanding and agree with the plan   Condition -stable  Shon Hale M.D on 11/14/2023 at 4:53 PM Go to [email or web address] -  for contact info  [facility] - Office  [phone]

## 2023-11-14 NOTE — ED Notes (Signed)
Patient transported to CT

## 2023-11-14 NOTE — ED Triage Notes (Signed)
Pt c/o SOB without exertion for the past couple of days. Pt states having congestion for over 2 weeks and has been taking Robitussin DM with no relief. Pt states coughing up thick green mucus. No hx of breathing issues. Auditory wheezes heard while pt is at rest. Oxygen saturation during triage 87% after walking back to treatment room from waiting room. Pt placed on 2L of oxygen at this time.

## 2023-11-14 NOTE — Progress Notes (Signed)
Some medications held due to med rec not being completed, other PO meds given.

## 2023-11-14 NOTE — ED Provider Notes (Signed)
[place] EMERGENCY DEPARTMENT AT [facility] Provider Note   CSN: [id number] Arrival date & time: 11/14/23  6962     History  Chief Complaint  Patient presents with   Shortness of Breath    Angel Powell is a 68 y.o. male.  68 year old male presenting emergency department for shortness of breath.  Reports a week and a half ago having some rhinorrhea and congestion over the past week has had cough with productive green mucus.  Said worsening shortness of breath.  Went to urgent care Thursday where he was told he had pneumonia has been taking prednisone and Z-Pak since then.  Symptoms not improving and seemingly worse this morning.  Denies any chest pain.  Had hernia repair in September 2024, otherwise no other risk factors for PE   Shortness of Breath      Home Medications Prior to Admission medications   Medication Sig Start Date End Date Taking? Authorizing Provider  albuterol (VENTOLIN HFA) 108 (90 Base) MCG/ACT inhaler Inhale 2 puffs into the lungs every 4 (four) hours as needed. 01/29/23   [provider]  atorvastatin (LIPITOR) 20 MG tablet Take 20 mg by mouth daily.    [provider]  Brimonidine Tartrate (LUMIFY) 0.025 % SOLN Place 1-2 drops into both eyes 2 (two) times daily as needed (irritated/dry eyes).    [provider]  Cholecalciferol (VITAMIN D3) 50 MCG (2000 UT) capsule Take 2,000 Units by mouth daily. 05/25/23   [provider]  docusate sodium (COLACE) 100 MG capsule Take 1 capsule (100 mg total) by mouth 2 (two) times daily. 07/16/23 07/15/24  Pappayliou, Santina Evans A, DO  metFORMIN (GLUCOPHAGE) 500 MG tablet Take 1 tablet (500 mg total) by mouth 2 (two) times daily with a meal. Patient taking differently: Take 500 mg by mouth 2 (two) times daily as needed (elevated blood sugar). 02/06/22 07/08/24  Eber Hong, MD  omeprazole (PRILOSEC) 40 MG capsule Take 40 mg by mouth daily. 07/06/20   [provider]   oxyCODONE (ROXICODONE) 5 MG immediate release tablet Take 1 tablet (5 mg total) by mouth every 6 (six) hours as needed. Patient not taking: Reported on 10/07/2023 07/16/23   Pappayliou, Santina Evans A, DO  tamsulosin (FLOMAX) 0.4 MG CAPS capsule Take 0.4 mg by mouth daily. 10/13/21   [provider]  tiZANidine (ZANAFLEX) 4 MG tablet TAKE 1 TABLET BY MOUTH EVERY DAY 08/25/23   Vickki Hearing, MD      Allergies    Patient has no known allergies.    Review of Systems   Review of Systems  Respiratory:  Positive for shortness of breath.     Physical Exam Updated Vital Signs BP 135/84   Pulse 86   Temp 97.9 F (36.6 C) (Oral)   Resp (!) 22   Ht 5\' 9"  (1.753 m)   Wt 122.5 kg   SpO2 93%   BMI 39.87 kg/m  Physical Exam Vitals and nursing note reviewed.  Constitutional:      General: He is not in acute distress.    Appearance: He is not toxic-appearing.  HENT:     Head: Normocephalic.  Cardiovascular:     Rate and Rhythm: Normal rate and regular rhythm.  Pulmonary:     Effort: Pulmonary effort is normal. No respiratory distress.     Breath sounds: Wheezing present.  Musculoskeletal:     Right lower leg: No edema.     Left lower leg: No edema.  Skin:  General: Skin is warm.     Capillary Refill: Capillary refill takes less than 2 seconds.  Neurological:     Mental Status: He is alert and oriented to person, place, and time.  Psychiatric:        Mood and Affect: Mood normal.        Behavior: Behavior normal.     ED Results / Procedures / Treatments   Labs (all labs ordered are listed, but only abnormal results are displayed) Labs Reviewed  CBC - Abnormal; Notable for the following components:      Result Value   Hemoglobin 12.4 (*)    HCT 38.3 (*)    RDW 15.8 (*)    Platelets 146 (*)    All other components within normal limits  BASIC METABOLIC PANEL - Abnormal; Notable for the following components:   Potassium 3.3 (*)    Calcium 8.6 (*)    All  other components within normal limits  D-DIMER, QUANTITATIVE - Abnormal; Notable for the following components:   D-Dimer, Quant 1.54 (*)    All other components within normal limits  RESP PANEL BY RT-PCR (RSV, FLU A&B, COVID)  [id number]  HIV ANTIBODY (ROUTINE TESTING W REFLEX)  TROPONIN I (HIGH SENSITIVITY)  TROPONIN I (HIGH SENSITIVITY)    EKG EKG Interpretation Date/Time:  Sunday November 14 2023 09:44:31 EST Ventricular Rate:  93 PR Interval:  149 QRS Duration:  98 QT Interval:  373 QTC Calculation: 464 R Axis:   26  Text Interpretation: Sinus rhythm Abnormal R-wave progression, early transition Minimal ST depression, lateral leads Confirmed by Estanislado Pandy [id number]) on 11/14/2023 9:55:02 AM  Radiology CT Angio Chest PE W and/or Wo Contrast Result Date: 11/14/2023 CLINICAL DATA:  Shortness of breath, congestion EXAM: CT ANGIOGRAPHY CHEST WITH CONTRAST TECHNIQUE: Multidetector CT imaging of the chest was performed using the standard protocol during bolus administration of intravenous contrast. Multiplanar CT image reconstructions and MIPs were obtained to evaluate the vascular anatomy. RADIATION DOSE REDUCTION: This exam was performed according to the departmental dose-optimization program which includes automated exposure control, adjustment of the mA and/or kV according to patient size and/or use of iterative reconstruction technique. CONTRAST:  75mL OMNIPAQUE IOHEXOL 350 MG/ML SOLN COMPARISON:  None Available. FINDINGS: Cardiovascular: Satisfactory opacification of the pulmonary arteries to the segmental level. No evidence of pulmonary embolism. Normal heart size. Three-vessel coronary artery calcifications. Enlargement of the main pulmonary artery measuring up to 4.0 cm in caliber. No pericardial effusion. Mediastinum/Nodes: No enlarged mediastinal, hilar, or axillary lymph nodes. Thyroid gland, trachea, and esophagus demonstrate no significant findings. Lungs/Pleura: Mild paraseptal  emphysema. Diffuse bilateral bronchial wall thickening. Bibasilar scarring or atelectasis. No pleural effusion or pneumothorax. Upper Abdomen: No acute abnormality.  Hepatic steatosis. Musculoskeletal: Bilateral gynecomastia.  No acute osseous findings. Review of the MIP images confirms the above findings. IMPRESSION: 1. Negative examination for pulmonary embolism. 2. Emphysema and diffuse bilateral bronchial wall thickening. 3. Enlargement of the main pulmonary artery, as can be seen in pulmonary hypertension. 4. Coronary artery disease. 5. Hepatic steatosis. Emphysema (ICD10-J43.9). Electronically Signed   By: Jearld Lesch M.D.   On: 11/14/2023 13:28   DG Chest 2 View Result Date: 11/14/2023 CLINICAL DATA:  Shortness of breath for several days. Productive cough. EXAM: CHEST - 2 VIEW COMPARISON:  11/10/2023 FINDINGS: The heart size and mediastinal contours are within normal limits. Both lungs are clear. The visualized skeletal structures are unremarkable. IMPRESSION: No active cardiopulmonary disease. Electronically Signed   By: Danae Orleans  M.D.   On: 11/14/2023 10:40    Procedures Procedures    Medications Ordered in ED Medications  insulin aspart (novoLOG) injection 0-15 Units (has no administration in time range)  insulin aspart (novoLOG) injection 0-5 Units (has no administration in time range)  insulin aspart (novoLOG) injection 3 Units (has no administration in time range)  albuterol (PROVENTIL) (2.5 MG/3ML) 0.083% nebulizer solution 2.5 mg (has no administration in time range)  ipratropium-albuterol (DUONEB) 0.5-2.5 (3) MG/3ML nebulizer solution 3 mL (3 mLs Nebulization Not Given 11/14/23 1525)  dextromethorphan-guaiFENesin (MUCINEX DM) 30-600 MG per 12 hr tablet 1 tablet (has no administration in time range)  methylPREDNISolone sodium succinate (SOLU-MEDROL) 40 mg/mL injection 40 mg (has no administration in time range)  doxycycline (VIBRA-TABS) tablet 100 mg (has no administration in  time range)  atorvastatin (LIPITOR) tablet 20 mg (has no administration in time range)  senna-docusate (Senokot-S) tablet 2 tablet (has no administration in time range)  pantoprazole (PROTONIX) EC tablet 40 mg (has no administration in time range)  tamsulosin (FLOMAX) capsule 0.4 mg (has no administration in time range)  tiZANidine (ZANAFLEX) tablet 4 mg (has no administration in time range)  Brimonidine Tartrate 0.025 % SOLN 1-2 drop (has no administration in time range)  sodium chloride flush (NS) 0.9 % injection 3 mL (has no administration in time range)  sodium chloride flush (NS) 0.9 % injection 3 mL (has no administration in time range)  sodium chloride flush (NS) 0.9 % injection 3 mL (has no administration in time range)  0.9 %  sodium chloride infusion (has no administration in time range)  acetaminophen (TYLENOL) tablet 650 mg (has no administration in time range)    Or  acetaminophen (TYLENOL) suppository 650 mg (has no administration in time range)  traZODone (DESYREL) tablet 50 mg (has no administration in time range)  polyethylene glycol (MIRALAX / GLYCOLAX) packet 17 g (has no administration in time range)  bisacodyl (DULCOLAX) suppository 10 mg (has no administration in time range)  ondansetron (ZOFRAN) tablet 4 mg (has no administration in time range)    Or  ondansetron (ZOFRAN) injection 4 mg (has no administration in time range)  heparin injection 5,000 Units (has no administration in time range)  hydrALAZINE (APRESOLINE) injection 10 mg (has no administration in time range)  potassium chloride SA (KLOR-CON M) CR tablet 40 mEq (has no administration in time range)  ipratropium-albuterol (DUONEB) 0.5-2.5 (3) MG/3ML nebulizer solution 3 mL (3 mLs Nebulization Given 11/14/23 1115)  iohexol (OMNIPAQUE) 350 MG/ML injection 75 mL (75 mLs Intravenous Contrast Given 11/14/23 1256)  methylPREDNISolone sodium succinate (SOLU-MEDROL) 125 mg/2 mL injection 125 mg (125 mg Intravenous  Given 11/14/23 1337)  ipratropium-albuterol (DUONEB) 0.5-2.5 (3) MG/3ML nebulizer solution 3 mL (3 mLs Nebulization Given 11/14/23 1347)    ED Course/ Medical Decision Making/ A&P Clinical Course as of 11/14/23 1536  Sun Nov 14, 2023  1049 DG Chest 2 View IMPRESSION: No active cardiopulmonary disease.   [TY]  1331 CT Angio Chest PE W and/or Wo Contrast IMPRESSION: 1. Negative examination for pulmonary embolism. 2. Emphysema and diffuse bilateral bronchial wall thickening. 3. Enlargement of the main pulmonary artery, as can be seen in pulmonary hypertension. 4. Coronary artery disease. 5. Hepatic steatosis.  Emphysema (ICD10-J43.9).   [TY]  1536 I suspect patient symptoms secondary to COPD given emphysematous type changes seen on CT scan.  He still requiring oxygen.  Symptoms somewhat improved after breathing treatments.  Will admit for acute hypoxic respiratory failure in the setting of  presumed COPD exacerbation [TY]    Clinical Course User Index [TY] Coral Spikes, DO                                 Medical Decision Making This is a 68 year old male with morbid obesity, hyperlipidemia, diabetes with remote history of tobacco use presenting emergency department for shortness of breath and productive cough.  He is afebrile nontachycardic he was hypoxic in triage 87%.  Placed on 2 L nasal cannula and is saturating in the upper 90s.  Does not appear to be in significant respiratory distress, does have some wheezing on exam.  Unable to locate records from urgent care visit that he had where he was told he had pneumonia.  Will get basic labs, chest x-ray.  Anticipate admission given his new oxygen requirements.  See ED course for further MDM and disposition.  Amount and/or Complexity of Data Reviewed External Data Reviewed:     Details: Hernia repair September 2024, no other risk factors on chart review for PE.  No documented history of COPD/asthma.  Does not appear to have cardiac  history/CHF Labs: ordered. Decision-making details documented in ED Course. Radiology: ordered. Decision-making details documented in ED Course. ECG/medicine tests: ordered and independent interpretation performed. Decision-making details documented in ED Course.    Details: EKG appears to be normal sinus rhythm with normal intervals.  No QTc prolongation.  No ST segment changes to indicate ischemia  Risk Prescription drug management. Decision regarding hospitalization.          Final Clinical Impression(s) / ED Diagnoses Final diagnoses:  COPD exacerbation [facility])    Rx / DC Orders ED Discharge Orders     None         Coral Spikes, DO 11/14/23 1536

## 2023-11-14 NOTE — ED Notes (Signed)
ED TO INPATIENT HANDOFF REPORT  ED Nurse Name and Phone #: Bonita Quin [phone]  S Name/Age/Gender Angel Powell 68 y.o. male Room/Bed: APA19/APA19  Code Status   Code Status: Full Code  Home/SNF/Other Home Patient oriented to: self, place, time, and situation Is this baseline? Yes   Triage Complete: Triage complete  Chief Complaint COPD with acute exacerbation (HCC) [J44.1]  Triage Note Pt c/o SOB without exertion for the past couple of days. Pt states having congestion for over 2 weeks and has been taking Robitussin DM with no relief. Pt states coughing up thick green mucus. No hx of breathing issues. Auditory wheezes heard while pt is at rest. Oxygen saturation during triage 87% after walking back to treatment room from waiting room. Pt placed on 2L of oxygen at this time.    Allergies No Known Allergies  Level of Care/Admitting Diagnosis ED Disposition     ED Disposition  Admit   Condition  --   Comment  Hospital Area: [facility] [[id number]]  Level of Care: Telemetry [5]  Covid Evaluation: Asymptomatic - no recent exposure (last 10 days) testing not required  Diagnosis: COPD with acute exacerbation [facility]) [[id number]]  Admitting Physician: Marylyn Ishihara  Attending Physician: Marylyn Ishihara  Certification:: I certify this patient will need inpatient services for at least 2 midnights  Expected Medical Readiness: 11/16/2023          B Medical/Surgery History Past Medical History:  Diagnosis Date   GERD (gastroesophageal reflux disease)    Hypercholesteremia    Past Surgical History:  Procedure Laterality Date   bone spur     right   COLONOSCOPY  02/10/2007   Small internal hemorrhoids.  Otherwise normal colon.  Repeat in 10 years.   COLONOSCOPY WITH PROPOFOL N/A 07/25/2020   Procedure: COLONOSCOPY WITH PROPOFOL;  Surgeon: Corbin Ade, MD;  Location: [facility];  Service: Endoscopy;  Laterality: N/A;  7:30am   HERNIA REPAIR      Abdomen   ROTATOR CUFF REPAIR     left, [facility]; [place]   ROTATOR CUFF REPAIR Right      A IV Location/Drains/Wounds Patient Lines/Drains/Airways Status     Active Line/Drains/Airways     Name Placement date Placement time Site Days   Peripheral IV 11/14/23 20 G Left Antecubital 11/14/23  1234  Antecubital  less than 1   Incision - 3 Ports Abdomen Left;Lower Left;Mid Left;Upper 07/16/23  0900  -- 121            Intake/Output Last 24 hours No intake or output data in the 24 hours ending 11/14/23 1448  Labs/Imaging Results for orders placed or performed during the hospital encounter of 11/14/23 (from the past 48 hours)  CBC     Status: Abnormal   Collection Time: 11/14/23 10:34 AM  Result Value Ref Range   WBC 5.2 4.0 - 10.5 K/uL   RBC 4.32 4.22 - 5.81 MIL/uL   Hemoglobin 12.4 (L) 13.0 - 17.0 g/dL   HCT 82.9 (L) 56.2 - 13.0 %   MCV 88.7 80.0 - 100.0 fL   MCH 28.7 26.0 - 34.0 pg   MCHC 32.4 30.0 - 36.0 g/dL   RDW 86.5 (H) 78.4 - 69.6 %   Platelets 146 (L) 150 - 400 K/uL   nRBC 0.0 0.0 - 0.2 %    Comment: Performed at [facility], [address] [place], [place] [address]  Basic metabolic panel     Status: Abnormal  Collection Time: 11/14/23 10:34 AM  Result Value Ref Range   Sodium 140 135 - 145 mmol/L   Potassium 3.3 (L) 3.5 - 5.1 mmol/L   Chloride 103 98 - 111 mmol/L   CO2 24 22 - 32 mmol/L   Glucose, Bld 91 70 - 99 mg/dL    Comment: Glucose reference range applies only to samples taken after fasting for at least 8 hours.   BUN 15 8 - 23 mg/dL   Creatinine, Ser 2.53 0.61 - 1.24 mg/dL   Calcium 8.6 (L) 8.9 - 10.3 mg/dL   GFR, Estimated >66 >44 mL/min    Comment: (NOTE) Calculated using the CKD-EPI Creatinine Equation (2021)    Anion gap 13 5 - 15    Comment: Performed at [facility], [address] [place], [place] [address]  Troponin I (High Sensitivity)     Status: None   Collection Time: 11/14/23 10:34 AM  Result Value Ref Range   Troponin I (High  Sensitivity) 6 <18 ng/L    Comment: (NOTE) Elevated high sensitivity troponin I (hsTnI) values and significant  changes across serial measurements may suggest ACS but many other  chronic and acute conditions are known to elevate hsTnI results.  Refer to the "Links" section for chest pain algorithms and additional  guidance. Performed at [facility], [address] [place], [place] [address]   D-dimer, quantitative     Status: Abnormal   Collection Time: 11/14/23 10:34 AM  Result Value Ref Range   D-Dimer, Quant 1.54 (H) 0.00 - 0.50 ug/mL-FEU    Comment: (NOTE) At the manufacturer cut-off value of 0.5 g/mL FEU, this assay has a negative predictive value of 95-100%.This assay is intended for use in conjunction with a clinical pretest probability (PTP) assessment model to exclude pulmonary embolism (PE) and deep venous thrombosis (DVT) in outpatients suspected of PE or DVT. Results should be correlated with clinical presentation. Performed at [facility], [address], [place], [place] [address]   Troponin I (High Sensitivity)     Status: None   Collection Time: 11/14/23 12:34 PM  Result Value Ref Range   Troponin I (High Sensitivity) 6 <18 ng/L    Comment: (NOTE) Elevated high sensitivity troponin I (hsTnI) values and significant  changes across serial measurements may suggest ACS but many other  chronic and acute conditions are known to elevate hsTnI results.  Refer to the "Links" section for chest pain algorithms and additional  guidance. Performed at [facility], [address], [place], [place] [address]    CT Angio Chest PE W and/or Wo Contrast Result Date: 11/14/2023 CLINICAL DATA:  Shortness of breath, congestion EXAM: CT ANGIOGRAPHY CHEST WITH CONTRAST TECHNIQUE: Multidetector CT imaging of the chest was performed using the standard protocol during bolus administration of intravenous contrast. Multiplanar CT image reconstructions and MIPs were obtained to evaluate the  vascular anatomy. RADIATION DOSE REDUCTION: This exam was performed according to the departmental dose-optimization program which includes automated exposure control, adjustment of the mA and/or kV according to patient size and/or use of iterative reconstruction technique. CONTRAST:  75mL OMNIPAQUE IOHEXOL 350 MG/ML SOLN COMPARISON:  None Available. FINDINGS: Cardiovascular: Satisfactory opacification of the pulmonary arteries to the segmental level. No evidence of pulmonary embolism. Normal heart size. Three-vessel coronary artery calcifications. Enlargement of the main pulmonary artery measuring up to 4.0 cm in caliber. No pericardial effusion. Mediastinum/Nodes: No enlarged mediastinal, hilar, or axillary lymph nodes. Thyroid gland, trachea, and esophagus demonstrate no significant findings. Lungs/Pleura: Mild paraseptal  emphysema. Diffuse bilateral bronchial wall thickening. Bibasilar scarring or atelectasis. No pleural effusion or pneumothorax. Upper Abdomen: No acute abnormality.  Hepatic steatosis. Musculoskeletal: Bilateral gynecomastia.  No acute osseous findings. Review of the MIP images confirms the above findings. IMPRESSION: 1. Negative examination for pulmonary embolism. 2. Emphysema and diffuse bilateral bronchial wall thickening. 3. Enlargement of the main pulmonary artery, as can be seen in pulmonary hypertension. 4. Coronary artery disease. 5. Hepatic steatosis. Emphysema (ICD10-J43.9). Electronically Signed   By: Jearld Lesch M.D.   On: 11/14/2023 13:28   DG Chest 2 View Result Date: 11/14/2023 CLINICAL DATA:  Shortness of breath for several days. Productive cough. EXAM: CHEST - 2 VIEW COMPARISON:  11/10/2023 FINDINGS: The heart size and mediastinal contours are within normal limits. Both lungs are clear. The visualized skeletal structures are unremarkable. IMPRESSION: No active cardiopulmonary disease. Electronically Signed   By: Danae Orleans M.D.   On: 11/14/2023 10:40    Pending  Labs Unresulted Labs (From admission, onward)     Start     Ordered   11/15/23 0500  Basic metabolic panel  Tomorrow morning,   R        11/14/23 1447   11/14/23 1447  HIV Antibody (routine testing w rflx)  (HIV Antibody (Routine testing w reflex) panel)  Once,   R        11/14/23 1447   11/14/23 1444  Resp panel by RT-PCR (RSV, Flu A&B, Covid) Anterior Nasal Swab  (Tier 2 - SARS Coronavirus 2 by RT PCR (hospital order, performed in [facility] hospital lab) *cepheid single result test*)  Once,   R        11/14/23 1444            Vitals/Pain Today's Vitals   11/14/23 1235 11/14/23 1245 11/14/23 1330 11/14/23 1400  BP:  120/82 135/77 131/86  Pulse:  84    Resp:  (!) 21 (!) 21 (!) 22  Temp:      TempSrc:      SpO2:  93%    Weight:      Height:      PainSc: 0-No pain       Isolation Precautions No active isolations  Medications Medications  insulin aspart (novoLOG) injection 0-15 Units (has no administration in time range)  insulin aspart (novoLOG) injection 0-5 Units (has no administration in time range)  insulin aspart (novoLOG) injection 3 Units (has no administration in time range)  albuterol (PROVENTIL) (2.5 MG/3ML) 0.083% nebulizer solution 2.5 mg (has no administration in time range)  ipratropium-albuterol (DUONEB) 0.5-2.5 (3) MG/3ML nebulizer solution 3 mL (has no administration in time range)  dextromethorphan-guaiFENesin (MUCINEX DM) 30-600 MG per 12 hr tablet 1 tablet (has no administration in time range)  methylPREDNISolone sodium succinate (SOLU-MEDROL) 40 mg/mL injection 40 mg (has no administration in time range)  doxycycline (VIBRA-TABS) tablet 100 mg (has no administration in time range)  atorvastatin (LIPITOR) tablet 20 mg (has no administration in time range)  senna-docusate (Senokot-S) tablet 2 tablet (has no administration in time range)  pantoprazole (PROTONIX) EC tablet 40 mg (has no administration in time range)  tamsulosin (FLOMAX) capsule 0.4 mg  (has no administration in time range)  tiZANidine (ZANAFLEX) tablet 4 mg (has no administration in time range)  Brimonidine Tartrate 0.025 % SOLN 1-2 drop (has no administration in time range)  sodium chloride flush (NS) 0.9 % injection 3 mL (has no administration in time range)  sodium chloride flush (NS) 0.9 % injection 3  mL (has no administration in time range)  sodium chloride flush (NS) 0.9 % injection 3 mL (has no administration in time range)  0.9 %  sodium chloride infusion (has no administration in time range)  acetaminophen (TYLENOL) tablet 650 mg (has no administration in time range)    Or  acetaminophen (TYLENOL) suppository 650 mg (has no administration in time range)  traZODone (DESYREL) tablet 50 mg (has no administration in time range)  polyethylene glycol (MIRALAX / GLYCOLAX) packet 17 g (has no administration in time range)  bisacodyl (DULCOLAX) suppository 10 mg (has no administration in time range)  ondansetron (ZOFRAN) tablet 4 mg (has no administration in time range)    Or  ondansetron (ZOFRAN) injection 4 mg (has no administration in time range)  heparin injection 5,000 Units (has no administration in time range)  hydrALAZINE (APRESOLINE) injection 10 mg (has no administration in time range)  potassium chloride SA (KLOR-CON M) CR tablet 40 mEq (has no administration in time range)  ipratropium-albuterol (DUONEB) 0.5-2.5 (3) MG/3ML nebulizer solution 3 mL (3 mLs Nebulization Given 11/14/23 1115)  iohexol (OMNIPAQUE) 350 MG/ML injection 75 mL (75 mLs Intravenous Contrast Given 11/14/23 1256)  methylPREDNISolone sodium succinate (SOLU-MEDROL) 125 mg/2 mL injection 125 mg (125 mg Intravenous Given 11/14/23 1337)  ipratropium-albuterol (DUONEB) 0.5-2.5 (3) MG/3ML nebulizer solution 3 mL (3 mLs Nebulization Given 11/14/23 1347)    Mobility walks     Focused Assessments Pt normally on room air at home. Now on 2 lpm nasal cannula.    R Recommendations: See Admitting  Provider Note  Report given to:   Additional Notes: A&O x4, ambulates independently, ADL's independent, pleasant. Glasses , hat and shirt in bag. Glasses protected by shirt and pt aware.

## 2023-11-15 ENCOUNTER — Inpatient Hospital Stay (HOSPITAL_COMMUNITY): Payer: BC Managed Care – PPO

## 2023-11-15 DIAGNOSIS — R0609 Other forms of dyspnea: Secondary | ICD-10-CM | POA: Diagnosis not present

## 2023-11-15 DIAGNOSIS — J441 Chronic obstructive pulmonary disease with (acute) exacerbation: Secondary | ICD-10-CM | POA: Diagnosis not present

## 2023-11-15 LAB — ECHOCARDIOGRAM COMPLETE
AR max vel: 3.15 cm2
AV Area VTI: 3.73 cm2
AV Area mean vel: 3.6 cm2
AV Mean grad: 4.7 mm[Hg]
AV Peak grad: 10.9 mm[Hg]
Ao pk vel: 1.65 m/s
Area-P 1/2: 2.24 cm2
Height: 68 in
S' Lateral: 3.4 cm
Weight: 4224 [oz_av]

## 2023-11-15 LAB — GLUCOSE, CAPILLARY
Glucose-Capillary: 257 mg/dL — ABNORMAL HIGH (ref 70–99)
Glucose-Capillary: 214 mg/dL — ABNORMAL HIGH (ref 70–99)
Glucose-Capillary: 194 mg/dL — ABNORMAL HIGH (ref 70–99)

## 2023-11-15 LAB — BASIC METABOLIC PANEL
Anion gap: 11 (ref 5–15)
BUN: 14 mg/dL (ref 8–23)
CO2: 24 mmol/L (ref 22–32)
Calcium: 9.3 mg/dL (ref 8.9–10.3)
Chloride: 102 mmol/L (ref 98–111)
Creatinine, Ser: 0.92 mg/dL (ref 0.61–1.24)
GFR, Estimated: >60 mL/min (ref >60)
Glucose, Bld: 199 mg/dL — ABNORMAL HIGH (ref 70–99)
Potassium: 4.1 mmol/L (ref 3.5–5.1)
Sodium: 137 mmol/L (ref 135–145)

## 2023-11-15 LAB — HIV ANTIBODY (ROUTINE TESTING W REFLEX): HIV Screen 4th Generation wRfx: NONREACTIVE

## 2023-11-15 MED ORDER — IPRATROPIUM-ALBUTEROL 0.5-2.5 (3) MG/3ML IN SOLN
3.0000 mL | Freq: Three times a day (TID) | RESPIRATORY_TRACT | Status: DC
  Administered 2023-11-16: 3 mL via RESPIRATORY_TRACT
  Filled 2023-11-15: qty 3

## 2023-11-15 NOTE — Progress Notes (Signed)
*  PRELIMINARY RESULTS* Echocardiogram 2D Echocardiogram has been performed.  Stacey Drain 11/15/2023, 4:23 PM

## 2023-11-15 NOTE — Progress Notes (Signed)
PROGRESS NOTE   Angel Powell, is a 68 y.o. male, DOB - Sep 28, 1956, MVH:[id number]  Admit date - 11/14/2023   Admitting Physician Samaad Hashem Mariea Clonts, MD  Outpatient Primary MD for the patient is Fanta, Wayland Salinas, MD  LOS - 1  Chief Complaint  Patient presents with   Shortness of Breath      Brief Narrative:   68 y.o. male reformed smoker  (quit smoking about 10 years ago ) with past medical history relevant for BPH, HLD, GERD, DM2 and COPD/emphysema admitted on 11/14/2023 with acute hypoxic respiratory failure due to acute COPD exacer    -Assessment and Plan:  1)Acute COPD Exacerbation-failed outpatient treatment with steroids antibiotics by urgent care facility --COVID, flu and RSV negative  no definite pneumonia,   Clinically improving --cough dyspnea and hypoxia improving  -c/n  IV Solu-Medrol   mucolytics, doxycycline and bronchodilators as ordered, supplemental oxygen as ordered.      2)Acute hypoxic respiratory failure--due to #1 above -Manage as above #1 -Overall improving and weaning off oxygen   3)Anemia and Thrombocytopenia--this appears not to be new -Patient has hepatic steatosis -No bleeding concerns -Monitor closely   4)DM2--- recent A1c 7.2 reflecting uncontrolled diabetes with hyperglycemia PTA -Anticipate worsening hyperglycemic control with steroids -Hold metformin given contrast study with CTA -Use Novolog/Humalog Sliding scale insulin with Accu-Cheks/Fingersticks as ordered    5)Other--CTA chest today with findings suggestive of  possible pulmonary hypertension as well as hepatic steatosis and CAD --Echocardiogram requested -- Outpatient follow-up with PCP for further management advised   6)GERD--continue PPI especially while on steroids   7)BPH--continue Flomax   8)HLD--continue Lipitor  Status is: Inpatient   Disposition: The patient is from: Home              Anticipated d/c is to: Home              Anticipated d/c date is: 1 day               Patient currently is not medically stable to d/c. Barriers: Not Clinically Stable-   Code Status : -  Code Status: Full Code   Family Communication:   NA (patient is alert, awake and coherent)   DVT Prophylaxis  :   - SCDs  heparin injection 5,000 Units Start: 11/14/23 2200 SCDs Start: 11/14/23 1447 Place TED hose Start: 11/14/23 1447   Lab Results  Component Value Date   PLT 146 (L) 11/14/2023    Inpatient Medications  Scheduled Meds:  atorvastatin  20 mg Oral Daily   dextromethorphan-guaiFENesin  1 tablet Oral BID   doxycycline  100 mg Oral Q12H   heparin  5,000 Units Subcutaneous Q8H   insulin aspart  0-15 Units Subcutaneous TID WC   insulin aspart  0-5 Units Subcutaneous QHS   insulin aspart  3 Units Subcutaneous TID WC   ipratropium-albuterol  3 mL Nebulization Q6H   methylPREDNISolone (SOLU-MEDROL) injection  40 mg Intravenous Q12H   pantoprazole  40 mg Oral Daily   senna-docusate  2 tablet Oral BID   sodium chloride flush  3 mL Intravenous Q12H   sodium chloride flush  3 mL Intravenous Q12H   tamsulosin  0.4 mg Oral Daily   tiZANidine  4 mg Oral Daily   Continuous Infusions: PRN Meds:.acetaminophen **OR** acetaminophen, albuterol, bisacodyl, hydrALAZINE, ondansetron **OR** ondansetron (ZOFRAN) IV, polyethylene glycol, polyvinyl alcohol, sodium chloride flush, traZODone   Anti-infectives (From admission, onward)    Start     Dose/Rate Route Frequency Ordered  Stop   11/14/23 1445  doxycycline (VIBRA-TABS) tablet 100 mg        100 mg Oral Every 12 hours 11/14/23 1442           Subjective: Angel Powell today has no fevers, no emesis,  No chest pain,   - Cough dyspnea and hypoxia improving -  Objective: Vitals:   11/15/23 0547 11/15/23 0816 11/15/23 1408 11/15/23 1436  BP: (!) 156/93  117/81   Pulse: 86  86   Resp: 20  18   Temp: 98.3 F (36.8 C)  98.6 F (37 C)   TempSrc: Oral     SpO2: 96% 98% 94% 94%  Weight:      Height:         Intake/Output Summary (Last 24 hours) at 11/15/2023 1612 Last data filed at 11/15/2023 1300 Gross per 24 hour  Intake 1210 ml  Output --  Net 1210 ml   Filed Weights   11/14/23 0949 11/14/23 1544  Weight: 122.5 kg 119.7 kg    Physical Exam  Gen:- Awake Alert, no conversational dyspnea HEENT:- [place].AT, No sclera icterus Nose- weaned off oxygen Neck-Supple Neck,No JVD,.  Lungs-improving air movement, few scattered wheezes  CV- S1, S2 normal, regular  Abd-  +ve B.Sounds, Abd Soft, No tenderness,    Extremity/Skin:- No  edema, pedal pulses present  Psych-affect is appropriate, oriented x3 Neuro-no new focal deficits, no tremors  Data Reviewed: I have personally reviewed following labs and imaging studies  CBC: Recent Labs  Lab 11/14/23 1034  WBC 5.2  HGB 12.4*  HCT 38.3*  MCV 88.7  PLT 146*   Basic Metabolic Panel: Recent Labs  Lab 11/14/23 1034 11/15/23 0505  NA 140 137  K 3.3* 4.1  CL 103 102  CO2 24 24  GLUCOSE 91 199*  BUN 15 14  CREATININE 0.88 0.92  CALCIUM 8.6* 9.3   GFR: Estimated Creatinine Clearance: 98.1 mL/min (by C-G formula based on SCr of 0.92 mg/dL).  Recent Results (from the past 240 hours)  Resp panel by RT-PCR (RSV, Flu A&B, Covid) Anterior Nasal Swab     Status: None   Collection Time: 11/14/23  2:53 PM   Specimen: Anterior Nasal Swab  Result Value Ref Range Status   SARS Coronavirus 2 by RT PCR NEGATIVE NEGATIVE Final    Comment: (NOTE) SARS-CoV-2 target nucleic acids are NOT DETECTED.  The SARS-CoV-2 RNA is generally detectable in upper respiratory specimens during the acute phase of infection. The lowest concentration of SARS-CoV-2 viral copies this assay can detect is 138 copies/mL. A negative result does not preclude SARS-Cov-2 infection and should not be used as the sole basis for treatment or other patient management decisions. A negative result may occur with  improper specimen collection/handling, submission of specimen  other than nasopharyngeal swab, presence of viral mutation(s) within the areas targeted by this assay, and inadequate number of viral copies(<138 copies/mL). A negative result must be combined with clinical observations, patient history, and epidemiological information. The expected result is Negative.  Fact Sheet for Patients:  BloggerCourse.com  Fact Sheet for Healthcare Providers:  SeriousBroker.it  This test is no t yet approved or cleared by the [place] FDA and  has been authorized for detection and/or diagnosis of SARS-CoV-2 by FDA under an Emergency Use Authorization (EUA). This EUA will remain  in effect (meaning this test can be used) for the duration of the COVID-19 declaration under Section 564(b)(1) of the Act, 21 U.S.C.section 360bbb-3(b)(1), unless the authorization  is terminated  or revoked sooner.       Influenza A by PCR NEGATIVE NEGATIVE Final   Influenza B by PCR NEGATIVE NEGATIVE Final    Comment: (NOTE) The Xpert Xpress SARS-CoV-2/FLU/RSV plus assay is intended as an aid in the diagnosis of influenza from Nasopharyngeal swab specimens and should not be used as a sole basis for treatment. Nasal washings and aspirates are unacceptable for Xpert Xpress SARS-CoV-2/FLU/RSV testing.  Fact Sheet for Patients: BloggerCourse.com  Fact Sheet for Healthcare Providers: SeriousBroker.it  This test is not yet approved or cleared by the [place] FDA and has been authorized for detection and/or diagnosis of SARS-CoV-2 by FDA under an Emergency Use Authorization (EUA). This EUA will remain in effect (meaning this test can be used) for the duration of the COVID-19 declaration under Section 564(b)(1) of the Act, 21 U.S.C. section 360bbb-3(b)(1), unless the authorization is terminated or revoked.     Resp Syncytial Virus by PCR NEGATIVE NEGATIVE Final     Comment: (NOTE) Fact Sheet for Patients: BloggerCourse.com  Fact Sheet for Healthcare Providers: SeriousBroker.it  This test is not yet approved or cleared by the [place] FDA and has been authorized for detection and/or diagnosis of SARS-CoV-2 by FDA under an Emergency Use Authorization (EUA). This EUA will remain in effect (meaning this test can be used) for the duration of the COVID-19 declaration under Section 564(b)(1) of the Act, 21 U.S.C. section 360bbb-3(b)(1), unless the authorization is terminated or revoked.  Performed at [facility], [address] [place], [place] [address]     Radiology Studies: CT Angio Chest PE W and/or Wo Contrast Result Date: 11/14/2023 CLINICAL DATA:  Shortness of breath, congestion EXAM: CT ANGIOGRAPHY CHEST WITH CONTRAST TECHNIQUE: Multidetector CT imaging of the chest was performed using the standard protocol during bolus administration of intravenous contrast. Multiplanar CT image reconstructions and MIPs were obtained to evaluate the vascular anatomy. RADIATION DOSE REDUCTION: This exam was performed according to the departmental dose-optimization program which includes automated exposure control, adjustment of the mA and/or kV according to patient size and/or use of iterative reconstruction technique. CONTRAST:  75mL OMNIPAQUE IOHEXOL 350 MG/ML SOLN COMPARISON:  None Available. FINDINGS: Cardiovascular: Satisfactory opacification of the pulmonary arteries to the segmental level. No evidence of pulmonary embolism. Normal heart size. Three-vessel coronary artery calcifications. Enlargement of the main pulmonary artery measuring up to 4.0 cm in caliber. No pericardial effusion. Mediastinum/Nodes: No enlarged mediastinal, hilar, or axillary lymph nodes. Thyroid gland, trachea, and esophagus demonstrate no significant findings. Lungs/Pleura: Mild paraseptal emphysema. Diffuse bilateral bronchial wall  thickening. Bibasilar scarring or atelectasis. No pleural effusion or pneumothorax. Upper Abdomen: No acute abnormality.  Hepatic steatosis. Musculoskeletal: Bilateral gynecomastia.  No acute osseous findings. Review of the MIP images confirms the above findings. IMPRESSION: 1. Negative examination for pulmonary embolism. 2. Emphysema and diffuse bilateral bronchial wall thickening. 3. Enlargement of the main pulmonary artery, as can be seen in pulmonary hypertension. 4. Coronary artery disease. 5. Hepatic steatosis. Emphysema (ICD10-J43.9). Electronically Signed   By: Jearld Lesch M.D.   On: 11/14/2023 13:28   DG Chest 2 View Result Date: 11/14/2023 CLINICAL DATA:  Shortness of breath for several days. Productive cough. EXAM: CHEST - 2 VIEW COMPARISON:  11/10/2023 FINDINGS: The heart size and mediastinal contours are within normal limits. Both lungs are clear. The visualized skeletal structures are unremarkable. IMPRESSION: No active cardiopulmonary disease. Electronically Signed   By: Danae Orleans M.D.   On: 11/14/2023 10:40   Scheduled Meds:  atorvastatin  20 mg Oral Daily   dextromethorphan-guaiFENesin  1 tablet Oral BID   doxycycline  100 mg Oral Q12H   heparin  5,000 Units Subcutaneous Q8H   insulin aspart  0-15 Units Subcutaneous TID WC   insulin aspart  0-5 Units Subcutaneous QHS   insulin aspart  3 Units Subcutaneous TID WC   ipratropium-albuterol  3 mL Nebulization Q6H   methylPREDNISolone (SOLU-MEDROL) injection  40 mg Intravenous Q12H   pantoprazole  40 mg Oral Daily   senna-docusate  2 tablet Oral BID   sodium chloride flush  3 mL Intravenous Q12H   sodium chloride flush  3 mL Intravenous Q12H   tamsulosin  0.4 mg Oral Daily   tiZANidine  4 mg Oral Daily   Continuous Infusions:   LOS: 1 day   Shon Hale M.D on 11/15/2023 at 4:12 PM  Go to [email or web address] - for contact info  [facility] - Office  [phone]  If 7PM-7AM, please contact  night-coverage [email or web address] 11/15/2023, 4:12 PM

## 2023-11-15 NOTE — Progress Notes (Signed)
Pt's respirations and pulse remain elevated. He does not appear to be in distress. Denies increased shortness of breath. Ambulates without difficulty. MEWs turned yellow overnight due to respirations and heart rate in the 110's. No acute events overnight. Kellogg RN

## 2023-11-15 NOTE — Progress Notes (Deleted)
   11/14/23 2359  Assess: MEWS Score  Temp 98.4 F (36.9 C)  BP (!) 153/94  MAP (mmHg) 110  Pulse Rate (!) 103  ECG Heart Rate (!) 107  Resp (!) 22  SpO2 97 %  O2 Device Room Air  Assess: MEWS Score  MEWS Temp 0  MEWS Systolic 0  MEWS Pulse 1  MEWS RR 1  MEWS LOC 0  MEWS Score 2  MEWS Score Color Yellow  Assess: if the MEWS score is Yellow or Red  Were vital signs accurate and taken at a resting state? Yes  Does the patient meet 2 or more of the SIRS criteria? Yes  Does the patient have a confirmed or suspected source of infection? Yes  MEWS guidelines implemented  Yes, yellow  Treat  MEWS Interventions Considered administering scheduled or prn medications/treatments as ordered  Take Vital Signs  Increase Vital Sign Frequency  Yellow: Q2hr x1, continue Q4hrs until patient remains green for 12hrs  Escalate  MEWS: Escalate Yellow: Discuss with charge nurse and consider notifying provider and/or RRT  Notify: Charge Nurse/RN  Name of Charge Nurse/RN Notified [facility] RN  Provider Notification  Provider Name/Title Thomes Dinning  Date Provider Notified 11/15/23  Time Provider Notified 0008  Method of Notification Page  Notification Reason Other (Comment) (MEWS yellow)  Assess: SIRS CRITERIA  SIRS Temperature  0  SIRS Respirations  1  SIRS Pulse 1  SIRS WBC 0  SIRS Score Sum  2

## 2023-11-15 NOTE — Progress Notes (Signed)
   11/15/23 1507  TOC Brief Assessment  Insurance and Status Reviewed  Patient has primary care physician Yes  Home environment has been reviewed Home with spouse  Prior level of function: independent  Prior/Current Home Services No current home services  Social Drivers of Health Review SDOH reviewed no interventions necessary  Readmission risk has been reviewed Yes  Transition of care needs no transition of care needs at this time    Transition of Care Department [facility]) has reviewed patient and no TOC needs have been identified at this time. We will continue to monitor patient advancement through interdisciplinary progression rounds. If new patient transition needs arise, please place a TOC consult.

## 2023-11-15 NOTE — Progress Notes (Signed)
Mobility Specialist Progress Note:    11/15/23 1547  Mobility  Activity Ambulated with assistance in hallway  Level of Assistance Independent  Assistive Device None  Distance Ambulated (ft) 200 ft  Range of Motion/Exercises Active;All extremities  Activity Response Tolerated well  Mobility Referral Yes  Mobility visit 1 Mobility  Mobility Specialist Start Time (ACUTE ONLY) 1515  Mobility Specialist Stop Time (ACUTE ONLY) 1540  Mobility Specialist Time Calculation (min) (ACUTE ONLY) 25 min   Pt received in chair, agreeable to mobility. Independently able to stand and ambulate with no AD. Tolerated well, SpO2 94% on RA throughout session. Returned to chair, left pt off [place]. Nurse notified, all needs met.  Lea Baine Mobility Specialist Please contact via Special educational needs teacher or  Rehab office at [phone]

## 2023-11-15 NOTE — Plan of Care (Signed)

## 2023-11-15 NOTE — Progress Notes (Signed)
   11/14/23 2359  Vitals  Temp 98.4 F (36.9 C)  Temp Source Oral  BP (!) 153/94  MAP (mmHg) 110  BP Location Right Arm  BP Method Automatic  Patient Position (if appropriate) Lying  Pulse Rate (!) 103  Pulse Rate Source [place]am[place]  ECG Heart Rate (!) 107  Resp (!) 22  MEWS COLOR  MEWS Score Color Yellow  Oxygen Therapy  SpO2 97 %  O2 Device Room Air  MEWS Score  MEWS Temp 0  MEWS Systolic 0  MEWS Pulse 1  MEWS RR 1  MEWS LOC 0  MEWS Score 2   Charge RN Shannon, informed.

## 2023-11-16 DIAGNOSIS — J441 Chronic obstructive pulmonary disease with (acute) exacerbation: Secondary | ICD-10-CM | POA: Diagnosis not present

## 2023-11-16 LAB — GLUCOSE, CAPILLARY
Glucose-Capillary: 245 mg/dL — ABNORMAL HIGH (ref 70–99)
Glucose-Capillary: 128 mg/dL — ABNORMAL HIGH (ref 70–99)
Glucose-Capillary: 191 mg/dL — ABNORMAL HIGH (ref 70–99)

## 2023-11-16 MED ORDER — ALBUTEROL SULFATE HFA 108 (90 BASE) MCG/ACT IN AERS: 2.0000 | INHALATION_SPRAY | RESPIRATORY_TRACT | 2 refills | Status: AC | PRN

## 2023-11-16 MED ORDER — OMEPRAZOLE 40 MG PO CPDR: 40.0000 mg | DELAYED_RELEASE_CAPSULE | Freq: Every day | ORAL | 5 refills | Status: AC

## 2023-11-16 MED ORDER — SENNOSIDES-DOCUSATE SODIUM 8.6-50 MG PO TABS: 2.0000 | ORAL_TABLET | Freq: Every day | ORAL | 5 refills | Status: AC

## 2023-11-16 MED ORDER — DOXYCYCLINE HYCLATE 100 MG PO TABS: 100.0000 mg | ORAL_TABLET | Freq: Two times a day (BID) | ORAL | 0 refills | Status: AC

## 2023-11-16 MED ORDER — TAMSULOSIN HCL 0.4 MG PO CAPS: 0.4000 mg | ORAL_CAPSULE | Freq: Every day | ORAL | 5 refills | Status: AC

## 2023-11-16 MED ORDER — PREDNISONE 20 MG PO TABS: 40.0000 mg | ORAL_TABLET | Freq: Every day | ORAL | 0 refills | Status: AC

## 2023-11-16 NOTE — Discharge Summary (Signed)
JACQUEL MCCAMISH, is a 68 y.o. male  DOB 06-19-1956  MRN [id number].  Admission date:  11/14/2023  Admitting Physician  Shon Hale, MD  Discharge Date:  11/16/2023   Primary MD  Benetta Spar, MD  Recommendations for primary care physician for things to follow:  1)Avoid ibuprofen/Advil/Aleve/Motrin/Goody Powders/Naproxen/BC powders/Meloxicam/Diclofenac/Indomethacin and other Nonsteroidal anti-inflammatory medications as these will make you more likely to bleed and can cause stomach ulcers, can also cause Kidney problems.   2)Please Note that there have been changes in your medications   Admission Diagnosis  COPD exacerbation (HCC) [J44.1] COPD with acute exacerbation (HCC) [J44.1]   Discharge Diagnosis  COPD exacerbation (HCC) [J44.1] COPD with acute exacerbation (HCC) [J44.1]    Principal Problem:   COPD with acute exacerbation (HCC) Active Problems:   GERD (gastroesophageal reflux disease)   Type 2 diabetes mellitus with hyperglycemia (HCC)      Past Medical History:  Diagnosis Date   GERD (gastroesophageal reflux disease)    Hypercholesteremia     Past Surgical History:  Procedure Laterality Date   bone spur     right   COLONOSCOPY  02/10/2007   Small internal hemorrhoids.  Otherwise normal colon.  Repeat in 10 years.   COLONOSCOPY WITH PROPOFOL N/A 07/25/2020   Procedure: COLONOSCOPY WITH PROPOFOL;  Surgeon: Corbin Ade, MD;  Location: [facility];  Service: Endoscopy;  Laterality: N/A;  7:30am   HERNIA REPAIR     Abdomen   ROTATOR CUFF REPAIR     left, [facility]; [place]   ROTATOR CUFF REPAIR Right        HPI  from the history and physical done on the day of admission:   Rohail Klees  is a 68 y.o. male reformed smoker  (quit smoking about 10 years ago ) with past medical history relevant for BPH, HLD, GERD, DM2 and COPD/emphysema presents to the ED with worsening  shortness of breath and cough x 2 weeks -He is vaccinated against the flu -Patient states cough is productive with thick green sputum -Rhinorrhea is worsened -He was started on prednisone and azithromycin a couple of days ago by urgent care provider -In the ED patient with expiratory wheezes and increased work of breathing O2 sats down to the mid 80s required 2 L of oxygen via nasal cannula -Chest x-ray without acute findings -D-dimer elevated at 1.54, CTA chest negative for PE patient does have emphysema with diffuse bilateral bronchial wall thickening and evidence of possible pulmonary hypertension as well as hepatic steatosis and CAD -COVID, flu and RSV negative Troponin is 6,  repeat troponin is 6, EKG is  sinus rhythm -Potassium is 3.3, creatinine 0.88 -WBC is 5.2, hemoglobin 12.4, platelets 146    Hospital Course:     Brief Narrative:   68 y.o. male reformed smoker  (quit smoking about 10 years ago ) with past medical history relevant for BPH, HLD, GERD, DM2 and COPD/emphysema admitted on 11/14/2023 with acute hypoxic respiratory failure due to acute COPD exacer     -Assessment  and Plan:   1)Acute COPD Exacerbation-failed outpatient treatment with steroids antibiotics by urgent care facility --COVID, flu and RSV negative  no definite pneumonia,   Clinically improving --cough dyspnea and hypoxia improving  -c/n  IV Solu-Medrol   mucolytics, doxycycline and bronchodilators as ordered, supplemental oxygen as ordered.      2)Acute hypoxic respiratory failure--due to #1 above -Manage as above #1 -Overall improving and weaning off oxygen   3)Anemia and Thrombocytopenia--this appears not to be new -Patient has hepatic steatosis -No bleeding concerns -Monitor closely   4)DM2--- recent A1c 7.2 reflecting uncontrolled diabetes with hyperglycemia PTA -Anticipate worsening hyperglycemic control with steroids -Hold metformin given contrast study with CTA -Use Novolog/Humalog Sliding  scale insulin with Accu-Cheks/Fingersticks as ordered    5)Other--CTA chest today with findings suggestive of  possible pulmonary hypertension as well as hepatic steatosis and CAD --Echocardiogram with EF of 65 to 70%, grade 1 diastolic dysfunction -Moderate LVH, No Mitral Stenosis, No Aortic Stenosis -- Outpatient follow-up with PCP for further management advised   6)GERD--continue PPI especially while on steroids   7)BPH--continue Flomax   8)HLD--continue Lipitor   Disposition: The patient is from: Home              Anticipated d/c is to: Home  Discharge Condition: Stable  Follow UP   Follow-up Information     Benetta Spar, MD. Schedule an appointment as soon as possible for a visit in 1 week(s).   Specialty: Internal Medicine Why: As needed Contact information: [address] [place] [address] [phone]                 Diet and Activity recommendation:  As advised  Discharge Instructions    **** Discharge Instructions     Call MD for:  difficulty breathing, headache or visual disturbances   Complete by: As directed    Call MD for:  persistant dizziness or light-headedness   Complete by: As directed    Call MD for:  temperature >100.4   Complete by: As directed    Diet - low sodium heart healthy   Complete by: As directed    Discharge instructions   Complete by: As directed    1)Avoid ibuprofen/Advil/Aleve/Motrin/Goody Powders/Naproxen/BC powders/Meloxicam/Diclofenac/Indomethacin and other Nonsteroidal anti-inflammatory medications as these will make you more likely to bleed and can cause stomach ulcers, can also cause Kidney problems.   2)Please Note that there have been changes in your medications   Increase activity slowly   Complete by: As directed         Discharge Medications     Allergies as of 11/16/2023   No Known Allergies      Medication List     STOP taking these medications    amoxicillin-clavulanate  875-125 MG tablet Commonly known as: AUGMENTIN   levofloxacin 750 MG tablet Commonly known as: LEVAQUIN   predniSONE 5 MG (21) Tbpk tablet Commonly known as: STERAPRED UNI-PAK 21 TAB Replaced by: predniSONE 20 MG tablet       TAKE these medications    acetaminophen 650 MG CR tablet Commonly known as: TYLENOL Take 1,300 mg by mouth every 8 (eight) hours as needed for pain.   albuterol 108 (90 Base) MCG/ACT inhaler Commonly known as: VENTOLIN HFA Inhale 2 puffs into the lungs every 4 (four) hours as needed for shortness of breath or wheezing. What changed: reasons to take this   atorvastatin 20 MG tablet Commonly known as: LIPITOR Take 20 mg by mouth daily.  doxycycline 100 MG tablet Commonly known as: VIBRA-TABS Take 1 tablet (100 mg total) by mouth 2 (two) times daily for 5 days.   Lumify 0.025 % Soln Generic drug: Brimonidine Tartrate Place 1-2 drops into both eyes 2 (two) times daily as needed (irritated/dry eyes).   metFORMIN 500 MG tablet Commonly known as: GLUCOPHAGE Take 1 tablet (500 mg total) by mouth 2 (two) times daily with a meal. What changed:  when to take this reasons to take this   omeprazole 40 MG capsule Commonly known as: PRILOSEC Take 1 capsule (40 mg total) by mouth daily.   predniSONE 20 MG tablet Commonly known as: DELTASONE Take 2 tablets (40 mg total) by mouth daily with breakfast for 5 days. Replaces: predniSONE 5 MG (21) Tbpk tablet   senna-docusate 8.6-50 MG tablet Commonly known as: Senokot-S Take 2 tablets by mouth at bedtime.   tamsulosin 0.4 MG Caps capsule Commonly known as: FLOMAX Take 1 capsule (0.4 mg total) by mouth daily after supper. What changed: when to take this   tiZANidine 4 MG tablet Commonly known as: ZANAFLEX TAKE 1 TABLET BY MOUTH EVERY DAY   Vitamin D3 50 MCG (2000 UT) capsule Take 2,000 Units by mouth daily.        Major procedures and Radiology Reports - PLEASE review detailed and final reports  for all details, in brief -   ECHOCARDIOGRAM COMPLETE Result Date: 11/15/2023    ECHOCARDIOGRAM REPORT   Patient Name:   MYRL LAZARUS Date of Exam: 11/15/2023 Medical Rec #:  [id number]     Height:       68.0 in Accession #:    [id number]    Weight:       264.0 lb Date of Birth:  10-15-1956     BSA:          2.300 m Patient Age:    67 years      BP:           117/81 mmHg Patient Gender: M             HR:           86 bpm. [facility] Procedure: 2D Echo, Cardiac Doppler and Color Doppler Indications:    Dyspnea R06.00  History:        Patient has no prior history of Echocardiogram examinations.                 COPD; Risk Factors:Diabetes.  Sonographer:    Celesta Gentile RCS Referring Phys: [id number] Ligia Duguay IMPRESSIONS  1. Left ventricular ejection fraction, by estimation, is 65 to 70%. The left ventricle has normal function. The left ventricle has no regional wall motion abnormalities. There is moderate left ventricular hypertrophy. Left ventricular diastolic parameters are consistent with Grade I diastolic dysfunction (impaired relaxation).  2. Right ventricular systolic function is normal. The right ventricular size is normal. Tricuspid regurgitation signal is inadequate for assessing PA pressure.  3. The mitral valve is normal in structure. No evidence of mitral valve regurgitation. No evidence of mitral stenosis.  4. The aortic valve is tricuspid. Aortic valve regurgitation is not visualized. No aortic stenosis is present.  5. Aortic dilatation noted. There is moderate dilatation of the aortic root, measuring 47 mm.  6. The inferior vena cava is normal in size with greater than 50% respiratory variability, suggesting right atrial pressure of 3 mmHg. FINDINGS  Left Ventricle: Left ventricular ejection fraction, by estimation, is 65 to 70%.  The left ventricle has normal function. The left ventricle has no regional wall motion abnormalities. The left ventricular internal cavity size was normal  in size. There is  moderate left ventricular hypertrophy. Left ventricular diastolic parameters are consistent with Grade I diastolic dysfunction (impaired relaxation). Normal left ventricular filling pressure. Right Ventricle: The right ventricular size is normal. Right vetricular wall thickness was not well visualized. Right ventricular systolic function is normal. Tricuspid regurgitation signal is inadequate for assessing PA pressure. Left Atrium: Left atrial size was normal in size. Right Atrium: Right atrial size was normal in size. Pericardium: There is no evidence of pericardial effusion. Mitral Valve: The mitral valve is normal in structure. No evidence of mitral valve regurgitation. No evidence of mitral valve stenosis. Tricuspid Valve: The tricuspid valve is normal in structure. Tricuspid valve regurgitation is not demonstrated. No evidence of tricuspid stenosis. Aortic Valve: The aortic valve is tricuspid. Aortic valve regurgitation is not visualized. No aortic stenosis is present. Aortic valve mean gradient measures 4.7 mmHg. Aortic valve peak gradient measures 10.9 mmHg. Aortic valve area, by VTI measures 3.73  cm. Pulmonic Valve: The pulmonic valve was not well visualized. Pulmonic valve regurgitation is not visualized. No evidence of pulmonic stenosis. Aorta: Aortic dilatation noted. There is moderate dilatation of the aortic root, measuring 47 mm. Venous: The inferior vena cava is normal in size with greater than 50% respiratory variability, suggesting right atrial pressure of 3 mmHg. IAS/Shunts: No atrial level shunt detected by color flow Doppler.  LEFT VENTRICLE PLAX 2D LVIDd:         5.00 cm   Diastology LVIDs:         3.40 cm   LV e' medial:    5.22 cm/s LV PW:         1.30 cm   LV E/e' medial:  14.1 LV IVS:        1.20 cm   LV e' lateral:   9.36 cm/s LVOT diam:     2.50 cm   LV E/e' lateral: 7.9 LV SV:         101 LV SV Index:   44 LVOT Area:     4.91 cm  RIGHT VENTRICLE RV S prime:     24.20  cm/s TAPSE (M-mode): 3.0 cm LEFT ATRIUM             Index        RIGHT ATRIUM           Index LA diam:        2.90 cm 1.26 cm/m   RA Area:     16.20 cm LA Vol (A2C):   72.6 ml 31.57 ml/m  RA Volume:   40.00 ml  17.39 ml/m LA Vol (A4C):   53.4 ml 23.22 ml/m LA Biplane Vol: 64.3 ml 27.96 ml/m  AORTIC VALVE AV Area (Vmax):    3.15 cm AV Area (Vmean):   3.60 cm AV Area (VTI):     3.73 cm AV Vmax:           165.06 cm/s AV Vmean:          99.384 cm/s AV VTI:            0.269 m AV Peak Grad:      10.9 mmHg AV Mean Grad:      4.7 mmHg LVOT Vmax:         106.00 cm/s LVOT Vmean:        72.850 cm/s LVOT VTI:  0.205 m LVOT/AV VTI ratio: 0.76  AORTA Ao Root diam: 4.70 cm MITRAL VALVE MV Area (PHT): 2.24 cm     SHUNTS MV Decel Time: 338 msec     Systemic VTI:  0.20 m MV E velocity: 73.70 cm/s   Systemic Diam: 2.50 cm MV A velocity: 102.00 cm/s MV E/A ratio:  0.72 Dina Rich MD Electronically signed by Dina Rich MD Signature Date/Time: 11/15/2023/4:25:30 PM    Final    CT Angio Chest PE W and/or Wo Contrast Result Date: 11/14/2023 CLINICAL DATA:  Shortness of breath, congestion EXAM: CT ANGIOGRAPHY CHEST WITH CONTRAST TECHNIQUE: Multidetector CT imaging of the chest was performed using the standard protocol during bolus administration of intravenous contrast. Multiplanar CT image reconstructions and MIPs were obtained to evaluate the vascular anatomy. RADIATION DOSE REDUCTION: This exam was performed according to the departmental dose-optimization program which includes automated exposure control, adjustment of the mA and/or kV according to patient size and/or use of iterative reconstruction technique. CONTRAST:  75mL OMNIPAQUE IOHEXOL 350 MG/ML SOLN COMPARISON:  None Available. FINDINGS: Cardiovascular: Satisfactory opacification of the pulmonary arteries to the segmental level. No evidence of pulmonary embolism. Normal heart size. Three-vessel coronary artery calcifications. Enlargement of the main  pulmonary artery measuring up to 4.0 cm in caliber. No pericardial effusion. Mediastinum/Nodes: No enlarged mediastinal, hilar, or axillary lymph nodes. Thyroid gland, trachea, and esophagus demonstrate no significant findings. Lungs/Pleura: Mild paraseptal emphysema. Diffuse bilateral bronchial wall thickening. Bibasilar scarring or atelectasis. No pleural effusion or pneumothorax. Upper Abdomen: No acute abnormality.  Hepatic steatosis. Musculoskeletal: Bilateral gynecomastia.  No acute osseous findings. Review of the MIP images confirms the above findings. IMPRESSION: 1. Negative examination for pulmonary embolism. 2. Emphysema and diffuse bilateral bronchial wall thickening. 3. Enlargement of the main pulmonary artery, as can be seen in pulmonary hypertension. 4. Coronary artery disease. 5. Hepatic steatosis. Emphysema (ICD10-J43.9). Electronically Signed   By: Jearld Lesch M.D.   On: 11/14/2023 13:28   DG Chest 2 View Result Date: 11/14/2023 CLINICAL DATA:  Shortness of breath for several days. Productive cough. EXAM: CHEST - 2 VIEW COMPARISON:  11/10/2023 FINDINGS: The heart size and mediastinal contours are within normal limits. Both lungs are clear. The visualized skeletal structures are unremarkable. IMPRESSION: No active cardiopulmonary disease. Electronically Signed   By: Danae Orleans M.D.   On: 11/14/2023 10:40   Micro Results   Recent Results (from the past 240 hours)  Resp panel by RT-PCR (RSV, Flu A&B, Covid) Anterior Nasal Swab     Status: None   Collection Time: 11/14/23  2:53 PM   Specimen: Anterior Nasal Swab  Result Value Ref Range Status   SARS Coronavirus 2 by RT PCR NEGATIVE NEGATIVE Final    Comment: (NOTE) SARS-CoV-2 target nucleic acids are NOT DETECTED.  The SARS-CoV-2 RNA is generally detectable in upper respiratory specimens during the acute phase of infection. The lowest concentration of SARS-CoV-2 viral copies this assay can detect is 138 copies/mL. A negative  result does not preclude SARS-Cov-2 infection and should not be used as the sole basis for treatment or other patient management decisions. A negative result may occur with  improper specimen collection/handling, submission of specimen other than nasopharyngeal swab, presence of viral mutation(s) within the areas targeted by this assay, and inadequate number of viral copies(<138 copies/mL). A negative result must be combined with clinical observations, patient history, and epidemiological information. The expected result is Negative.  Fact Sheet for Patients:  BloggerCourse.com  Fact Sheet for Healthcare  Providers:  SeriousBroker.it  This test is no t yet approved or cleared by the [place] and  has been authorized for detection and/or diagnosis of SARS-CoV-2 by FDA under an Emergency Use Authorization (EUA). This EUA will remain  in effect (meaning this test can be used) for the duration of the COVID-19 declaration under Section 564(b)(1) of the Act, 21 U.S.C.section 360bbb-3(b)(1), unless the authorization is terminated  or revoked sooner.       Influenza A by PCR NEGATIVE NEGATIVE Final   Influenza B by PCR NEGATIVE NEGATIVE Final    Comment: (NOTE) The Xpert Xpress SARS-CoV-2/FLU/RSV plus assay is intended as an aid in the diagnosis of influenza from Nasopharyngeal swab specimens and should not be used as a sole basis for treatment. Nasal washings and aspirates are unacceptable for Xpert Xpress SARS-CoV-2/FLU/RSV testing.  Fact Sheet for Patients: BloggerCourse.com  Fact Sheet for Healthcare Providers: SeriousBroker.it  This test is not yet approved or cleared by the [place] FDA and has been authorized for detection and/or diagnosis of SARS-CoV-2 by FDA under an Emergency Use Authorization (EUA). This EUA will remain in effect (meaning this test can be used)  for the duration of the COVID-19 declaration under Section 564(b)(1) of the Act, 21 U.S.C. section 360bbb-3(b)(1), unless the authorization is terminated or revoked.     Resp Syncytial Virus by PCR NEGATIVE NEGATIVE Final    Comment: (NOTE) Fact Sheet for Patients: BloggerCourse.com  Fact Sheet for Healthcare Providers: SeriousBroker.it  This test is not yet approved or cleared by the [place] FDA and has been authorized for detection and/or diagnosis of SARS-CoV-2 by FDA under an Emergency Use Authorization (EUA). This EUA will remain in effect (meaning this test can be used) for the duration of the COVID-19 declaration under Section 564(b)(1) of the Act, 21 U.S.C. section 360bbb-3(b)(1), unless the authorization is terminated or revoked.  Performed at [facility], [address] [place], [place] [address]    Today   Subjective    Dakai Braithwaite today has no ***          Patient has been seen and examined prior to discharge   Objective   Blood pressure 133/80, pulse 83, temperature 98.6 F (37 C), temperature source Oral, resp. rate 18, height 5\' 8"  (1.727 m), weight 119.7 kg, SpO2 96%.   Intake/Output Summary (Last 24 hours) at 11/16/2023 1137 Last data filed at 11/15/2023 1829 Gross per 24 hour  Intake 960 ml  Output --  Net 960 ml    Exam Gen:- Awake Alert, no acute distress *** HEENT:- [place].AT, No sclera icterus Neck-Supple Neck,No JVD,.  Lungs-  CTAB , good air movement bilaterally CV- S1, S2 normal, regular Abd-  +ve B.Sounds, Abd Soft, No tenderness,    Extremity/Skin:- No  edema,   good pulses Psych-affect is appropriate, oriented x3 Neuro-no new focal deficits, no tremors ***   Data Review   CBC w Diff:  Lab Results  Component Value Date   WBC 5.2 11/14/2023   HGB 12.4 (L) 11/14/2023   HCT 38.3 (L) 11/14/2023   PLT 146 (L) 11/14/2023   LYMPHOPCT 33 10/15/2023   MONOPCT 9 10/15/2023   EOSPCT  2 10/15/2023   BASOPCT 0 10/15/2023    CMP:  Lab Results  Component Value Date   NA 137 11/15/2023   K 4.1 11/15/2023   CL 102 11/15/2023   CO2 24 11/15/2023   BUN 14 11/15/2023   CREATININE 0.92 11/15/2023   PROT 7.8  10/15/2023   ALBUMIN 3.8 10/15/2023   BILITOT 0.4 10/15/2023   ALKPHOS 83 10/15/2023   AST 28 10/15/2023   ALT 41 10/15/2023  .  Total Discharge time is about 33 minutes  Shon Hale M.D on 11/16/2023 at 11:37 AM  Go to [email or web address] -  for contact info  [facility] - Office  [phone]

## 2023-11-16 NOTE — TOC Transition Note (Signed)
Transition of Care [facility]) - Discharge Note   Patient Details  Name: Angel Powell MRN: [id number] Date of Birth: 1956-02-10  [facility] CM/SW Contact:  Leitha Bleak, RN Phone Number: 11/16/2023, 10:46 AM   Clinical Narrative:   Discharging home. Weaned off oxygen, no needs.     Barriers to Discharge: No Barriers Identified   Patient Goals and CMS Choice Patient states their goals for this hospitalization and ongoing recovery are:: to go home        Discharge Plan and Services Additional resources added to the After Visit Summary for     Social Drivers of Health (SDOH) Interventions SDOH Screenings   Food Insecurity: No Food Insecurity (11/14/2023)  Housing: Low Risk  (11/14/2023)  Transportation Needs: No Transportation Needs (11/14/2023)  Utilities: Not At Risk (11/14/2023)  Depression (PHQ2-9): Low Risk  (09/30/2022)  Social Connections: Socially Integrated (11/14/2023)  Tobacco Use: Medium Risk (11/14/2023)

## 2023-11-16 NOTE — Plan of Care (Signed)

## 2023-11-16 NOTE — Plan of Care (Signed)
Problem: Skin Integrity: Goal: Risk for impaired skin integrity will decrease Outcome: Progressing   Problem: Activity: Goal: Risk for activity intolerance will decrease Outcome: Progressing   Problem: Coping: Goal: Level of anxiety will decrease Outcome: Progressing

## 2023-11-16 NOTE — Plan of Care (Signed)

## 2023-11-16 NOTE — Discharge Instructions (Signed)
1)Avoid ibuprofen/Advil/Aleve/Motrin/Goody Powders/Naproxen/BC powders/Meloxicam/Diclofenac/Indomethacin and other Nonsteroidal anti-inflammatory medications as these will make you more likely to bleed and can cause stomach ulcers, can also cause Kidney problems.   2)Please Note that there have been changes in your medications

## 2023-12-30 ENCOUNTER — Other Ambulatory Visit (HOSPITAL_COMMUNITY)
Admission: RE | Admit: 2023-12-30 | Discharge: 2023-12-30 | Disposition: A | Discharge status: Home / Self Care | Source: Ambulatory Visit | Attending: Internal Medicine | Admitting: Internal Medicine

## 2023-12-30 DIAGNOSIS — I10 Essential (primary) hypertension: Secondary | ICD-10-CM | POA: Insufficient documentation

## 2023-12-30 DIAGNOSIS — E785 Hyperlipidemia, unspecified: Secondary | ICD-10-CM | POA: Diagnosis not present

## 2023-12-30 LAB — CBC WITH DIFFERENTIAL/PLATELET
Abs Immature Granulocytes: 0.01 10*3/uL (ref 0.00–0.07)
Basophils Absolute: 0 10*3/uL (ref 0.0–0.1)
Basophils Relative: 1 %
Eosinophils Absolute: 0 10*3/uL (ref 0.0–0.5)
Eosinophils Relative: 1 %
HCT: 39.1 % (ref 39.0–52.0)
Hemoglobin: 12.7 g/dL — ABNORMAL LOW (ref 13.0–17.0)
Immature Granulocytes: 0 %
Lymphocytes Relative: 31 %
Lymphs Abs: 1.3 10*3/uL (ref 0.7–4.0)
MCH: 29.1 pg (ref 26.0–34.0)
MCHC: 32.5 g/dL (ref 30.0–36.0)
MCV: 89.5 fL (ref 80.0–100.0)
Monocytes Absolute: 0.5 10*3/uL (ref 0.1–1.0)
Monocytes Relative: 11 %
Neutro Abs: 2.4 10*3/uL (ref 1.7–7.7)
Neutrophils Relative %: 56 %
Platelets: 170 10*3/uL (ref 150–400)
RBC: 4.37 MIL/uL (ref 4.22–5.81)
RDW: 16 % — ABNORMAL HIGH (ref 11.5–15.5)
WBC: 4.1 10*3/uL (ref 4.0–10.5)
nRBC: 0 % (ref 0.0–0.2)

## 2024-01-31 ENCOUNTER — Other Ambulatory Visit: Payer: Self-pay | Admitting: Orthopedic Surgery

## 2024-01-31 DIAGNOSIS — M51369 Other intervertebral disc degeneration, lumbar region without mention of lumbar back pain or lower extremity pain: Secondary | ICD-10-CM

## 2024-02-22 ENCOUNTER — Telehealth: Payer: Self-pay | Admitting: Orthopedic Surgery

## 2024-02-22 NOTE — Telephone Encounter (Signed)
 IMPRESSION: Compared to 12/11/2020:   1. Redemonstration of prior supraspinatus rotator cuff repair. Interval enlargement of now a complete anterior to posterior full-thickness supraspinatus tendon footprint tear with tendon retraction approximately 4.5 cm to the superior aspect of the glenoid. There is also full-thickness tear of the far anterior infraspinatus tendon footprint, new from prior. 2. High-grade thinning of the superior subscapularis tendon insertion is similar to prior. 3. Moderate supraspinatus, mild superior subscapularis, and mild anterior infraspinatus muscle atrophy. 4. Worsened attenuation of the long head of the biceps tendon within the proximal aspect of the bicipital groove. Now findings are suspicious for a full-thickness proximal tendon rupture with distal tendon retraction. 5. Postsurgical changes of distal clavicle excision and acromioplasty. Moderate anterior inferior distal lateral acromial spurring again nearly contacts the humeral head. 6. Full-thickness cartilage loss within portions of the mid to inferior aspect of the humeral head cartilage, similar to prior.

## 2024-02-22 NOTE — Telephone Encounter (Signed)
 Dr. Delfino Powell pt - spoke w/the pt, he stated he had his MRI a long time ago (10/14/2023) and he hasn't heard anything.  His AVS says:  Return for MRI RESULTS, DR WIL CALL RESUTS TO YOU.  Please advise, does he need an appointment or will results be called to the pt?  [phone]

## 2024-03-10 ENCOUNTER — Ambulatory Visit: Admitting: Orthopedic Surgery

## 2024-03-10 ENCOUNTER — Encounter: Payer: Self-pay | Admitting: Orthopedic Surgery

## 2024-03-10 DIAGNOSIS — M48061 Spinal stenosis, lumbar region without neurogenic claudication: Secondary | ICD-10-CM | POA: Diagnosis not present

## 2024-03-10 NOTE — Progress Notes (Signed)
   Follow-up visit  Chief Complaint  Patient presents with   Follow-up    MRI review, wants to discuss surgery    Jameson was scheduled to be seen to talk about the MRI of his right shoulder which we will review below but he is more concerned today about his back pain.  He has chronic lower back pain for which he is getting injections in [place] through our facility from [place] however it is not helping  He says he can walk pretty good but he has bilateral leg pain with lower back pain and is getting worse  As far as the shoulder goes he says initially he could not lift his arm on his own but now he can though not as good as he could before  The pain is mild to moderate  He says he would rather have his back addressed than his shoulder  As far as the shoulder goes he has a recurrent rotator cuff tear with worse retraction of the supraspinatus tendon.  There is muscle atrophy.  The biceps tendon is involved.  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  No significant stenosis.   L1-L2: Mild disc bulging and endplate spurring with moderate left foraminal stenosis, similar.   L2-L3: Mild disc bulging and endplate spurring. Left greater than right facet arthropathy. Resulting moderate left foraminal stenosis, similar. Moderate left subarticular recess stenosis is also similar.   L3-L4: Broad disc bulge and bilateral facet arthropathy. Resulting mild to moderate bilateral foraminal stenosis, similar. Prominent circumferential epidural lipomatosis with mild canal stenosis, similar.   L4-L5: Broad disc bulge and endplate spurring. Bilateral facet arthropathy. Resulting mild-to-moderate bilateral foraminal stenosis,  similar. Epidural lipomatosis with mild canal stenosis, similar.   L5-S1: Right eccentric disc bulging with right greater than left facet arthropathy. Resulting moderate right foraminal stenosis. Prominent dorsal epidural lipomatosis with mild canal stenosis, similar.   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     Electronically Signed   By: Stevenson Elbe M.D.   On: 02/06/2022 10:45

## 2024-03-12 ENCOUNTER — Other Ambulatory Visit: Payer: Self-pay | Admitting: Surgery

## 2024-03-22 ENCOUNTER — Other Ambulatory Visit (HOSPITAL_COMMUNITY)
Admission: RE | Admit: 2024-03-22 | Discharge: 2024-03-22 | Disposition: A | Discharge status: Home / Self Care | Source: Ambulatory Visit | Attending: Internal Medicine | Admitting: Internal Medicine

## 2024-03-22 DIAGNOSIS — N390 Urinary tract infection, site not specified: Secondary | ICD-10-CM | POA: Insufficient documentation

## 2024-03-22 LAB — URINALYSIS, W/ REFLEX TO CULTURE (INFECTION SUSPECTED)
Bilirubin Urine: NEGATIVE
Glucose, UA: NEGATIVE mg/dL
Hgb urine dipstick: NEGATIVE
Ketones, ur: NEGATIVE mg/dL
Nitrite: NEGATIVE
Protein, ur: NEGATIVE mg/dL
Specific Gravity, Urine: 1.02 (ref 1.005–1.030)
WBC, UA: >50 WBC/hpf (ref 0–5)
pH: 5 (ref 5.0–8.0)

## 2024-03-24 LAB — URINE CULTURE: Culture: >=100000 — AB

## 2024-03-28 ENCOUNTER — Other Ambulatory Visit: Payer: Self-pay | Admitting: Surgery

## 2024-04-12 ENCOUNTER — Ambulatory Visit: Admitting: Orthopedic Surgery

## 2024-04-12 ENCOUNTER — Other Ambulatory Visit (INDEPENDENT_AMBULATORY_CARE_PROVIDER_SITE_OTHER): Payer: Self-pay

## 2024-04-12 VITALS — BP 144/83 | HR 60 | Ht 69.0 in | Wt 260.0 lb

## 2024-04-12 DIAGNOSIS — M545 Low back pain, unspecified: Secondary | ICD-10-CM

## 2024-04-12 NOTE — Progress Notes (Signed)
 Orthopedic Spine Surgery Office Note  Assessment: Patient is a 68 y.o. male with chronic low back pain that has gotten progressively worse with time.  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   Plan: -Patient has tried Tylenol , Aleve, PT, oral steroids, muscle laxer's, epidural injections - Patient has tried multiple conservative treatments so I talked about his remaining options.  I told him he can continue with the treatments he has tried.  He could go to pain management.  We could also do an L3-S1 laminectomy.  I told him though that the laminectomy would be predictable at relieving his leg pain but would likely not help with his back pain.  He does have a degenerative scoliosis but he had his back pain for years and x-rays from 2020 do not show any significant lower lumbar degenerative scoliosis at that time, so I do not feel that correcting that would be of any benefit.  After this discussion, patient wanted to continue with what he was doing.  I told him the other 2 options would still be available if he changes his mind -Patient should return to office on as-needed basis   Patient expressed understanding of the plan and all questions were answered to the patient's satisfaction.   ___________________________________________________________________________   History:  Patient is a 68 y.o. male who presents today for lumbar spine.  Patient has had 40 years of low back pain.  Has gotten progressively worse over time.  He feels the pain in the lower lumbar region.  More recently, within the last couple of years, he has developed pain radiating into his bilateral lower extremities.  Feels going along the posterior aspect of his thighs and legs.  He sometimes gets tingling in his toes.  He says he feels this pain in his legs when he is standing or walking for couple minutes.  He gets better if he sits down.  He has the back pain at all times.  Does not matter if  he is sitting, standing, laying down in bed.  He has not found any treatments to provide him with significant or lasting relief.  He has tried multiple treatments over the years.   Weakness: Denies Symptoms of imbalance: Denies Paresthesias and numbness: Yes, gets numbness and paresthesias in his toes.  No other numbness or paresthesias Bowel or bladder incontinence: Denies Saddle anesthesia: Denies  Treatments tried: Tylenol , Aleve, PT, oral steroids, muscle laxer's, epidural injections  Review of systems: Denies fevers and chills, night sweats, unexplained weight loss, history of cancer, pain that wakes him at night  Past medical history: HTN HLD Diabetes (last A1c was 7.2 on 10/15/2023) GERD  Allergies: NKDA  Past surgical history:  Hernia repair Left shoulder rotator cuff repair  Social history: Denies use of nicotine product (smoking, vaping, patches, smokeless) Alcohol  use: Rare Denies recreational drug use   Physical Exam:  BMI of 38.4  General: no acute distress, appears stated age Neurologic: alert, answering questions appropriately, following commands Respiratory: unlabored breathing on room air, symmetric chest rise Psychiatric: appropriate affect, normal cadence to speech   MSK (spine):  -Strength exam      Left  Right EHL    5/5  5/5 TA    5/5  5/5 GSC    5/5  5/5 Knee extension  5/5  5/5 Hip flexion   5/5  5/5  -Sensory exam    Sensation intact to light touch in L3-S1 nerve distributions of bilateral lower extremities  -  Achilles DTR: 1/4 on the left, 1/4 on the right -Patellar tendon DTR: 1/4 on the left, 1/4 on the right  -Straight leg raise: Negative bilaterally -Clonus: no beats bilaterally  -Left hip exam: No pain through range of motion -Right hip exam: No pain to range of motion  Imaging: XRs of the lumbar spine from 04/12/2024 were independently reviewed and interpreted, showing lumbar degenerative scoliosis with apex to the right  at L2/3.  Disc height loss at L2/3, L3/4, L4/5.  Anterior osteophyte formation seen at L2/3 and L3/4.  No evidence of instability on flexion/extension views.  No fracture or dislocation seen.  MRI of the lumbar spine from 10/16/2022 was independently reviewed and interpreted, showing DDD at L4/5 and L5/S1.  Central stenosis at L3/4, L4/5, L5/S1.  Left sided foraminal stenosis at L2/3. Bilateral foraminal stenosis at L3/4 and L4/5. Right sided foraminal stenosis at L5/S1.    Patient name: Angel Powell Patient MRN: [id number] Date of visit: 04/12/24

## 2024-04-17 ENCOUNTER — Ambulatory Visit: Admitting: Urology

## 2024-05-04 ENCOUNTER — Other Ambulatory Visit (HOSPITAL_COMMUNITY)
Admission: RE | Admit: 2024-05-04 | Discharge: 2024-05-04 | Disposition: A | Discharge status: Home / Self Care | Source: Ambulatory Visit | Attending: Internal Medicine | Admitting: Internal Medicine

## 2024-05-04 DIAGNOSIS — I10 Essential (primary) hypertension: Secondary | ICD-10-CM | POA: Insufficient documentation

## 2024-05-04 DIAGNOSIS — E785 Hyperlipidemia, unspecified: Secondary | ICD-10-CM | POA: Diagnosis present

## 2024-05-04 DIAGNOSIS — D649 Anemia, unspecified: Secondary | ICD-10-CM | POA: Insufficient documentation

## 2024-05-04 DIAGNOSIS — E119 Type 2 diabetes mellitus without complications: Secondary | ICD-10-CM | POA: Insufficient documentation

## 2024-05-04 LAB — CBC WITH DIFFERENTIAL/PLATELET
Abs Immature Granulocytes: 0.01 K/uL (ref 0.00–0.07)
Basophils Absolute: 0 K/uL (ref 0.0–0.1)
Basophils Relative: 1 %
Eosinophils Absolute: 0.1 K/uL (ref 0.0–0.5)
Eosinophils Relative: 4 %
HCT: 38.4 % — ABNORMAL LOW (ref 39.0–52.0)
Hemoglobin: 12.7 g/dL — ABNORMAL LOW (ref 13.0–17.0)
Immature Granulocytes: 0 %
Lymphocytes Relative: 38 %
Lymphs Abs: 1.3 K/uL (ref 0.7–4.0)
MCH: 30.4 pg (ref 26.0–34.0)
MCHC: 33.1 g/dL (ref 30.0–36.0)
MCV: 91.9 fL (ref 80.0–100.0)
Monocytes Absolute: 0.4 K/uL (ref 0.1–1.0)
Monocytes Relative: 12 %
Neutro Abs: 1.5 K/uL — ABNORMAL LOW (ref 1.7–7.7)
Neutrophils Relative %: 45 %
Platelets: 141 K/uL — ABNORMAL LOW (ref 150–400)
RBC: 4.18 MIL/uL — ABNORMAL LOW (ref 4.22–5.81)
RDW: 16.2 % — ABNORMAL HIGH (ref 11.5–15.5)
WBC: 3.4 K/uL — ABNORMAL LOW (ref 4.0–10.5)
nRBC: 0 % (ref 0.0–0.2)

## 2024-05-04 LAB — BASIC METABOLIC PANEL WITH GFR
Anion gap: 12 (ref 5–15)
BUN: 15 mg/dL (ref 8–23)
CO2: 24 mmol/L (ref 22–32)
Calcium: 9 mg/dL (ref 8.9–10.3)
Chloride: 103 mmol/L (ref 98–111)
Creatinine, Ser: 0.91 mg/dL (ref 0.61–1.24)
GFR, Estimated: >60 mL/min (ref >60)
Glucose, Bld: 130 mg/dL — ABNORMAL HIGH (ref 70–99)
Potassium: 3.6 mmol/L (ref 3.5–5.1)
Sodium: 139 mmol/L (ref 135–145)

## 2024-05-04 LAB — HEPATIC FUNCTION PANEL
ALT: 27 U/L (ref 0–44)
AST: 25 U/L (ref 15–41)
Albumin: 3.8 g/dL (ref 3.5–5.0)
Alkaline Phosphatase: 76 U/L (ref 38–126)
Bilirubin, Direct: <0.1 mg/dL (ref 0.0–0.2)
Total Bilirubin: 0.4 mg/dL (ref 0.0–1.2)
Total Protein: 7.7 g/dL (ref 6.5–8.1)

## 2024-05-04 LAB — LIPID PANEL
Cholesterol: 174 mg/dL (ref 0–200)
HDL: 52 mg/dL (ref >40)
LDL Cholesterol: 109 mg/dL — ABNORMAL HIGH (ref 0–99)
Total CHOL/HDL Ratio: 3.3 ratio
Triglycerides: 65 mg/dL (ref <150)
VLDL: 13 mg/dL (ref 0–40)

## 2024-05-04 LAB — HEMOGLOBIN A1C
Hgb A1c MFr Bld: 5.9 % — ABNORMAL HIGH (ref 4.8–5.6)
Mean Plasma Glucose: 122.63 mg/dL

## 2024-05-05 LAB — MICROALBUMIN / CREATININE URINE RATIO
Creatinine, Urine: 181.9 mg/dL
Microalb Creat Ratio: 7 mg/g{creat} (ref 0–29)
Microalb, Ur: 12.4 ug/mL — ABNORMAL HIGH

## 2024-05-11 ENCOUNTER — Other Ambulatory Visit: Payer: Self-pay | Admitting: Orthopedic Surgery

## 2024-05-11 DIAGNOSIS — M51369 Other intervertebral disc degeneration, lumbar region without mention of lumbar back pain or lower extremity pain: Secondary | ICD-10-CM

## 2024-06-02 ENCOUNTER — Other Ambulatory Visit: Payer: Self-pay | Admitting: Surgery

## 2024-06-13 ENCOUNTER — Telehealth: Payer: Self-pay | Admitting: Orthopedic Surgery

## 2024-06-13 NOTE — Telephone Encounter (Signed)
 Tried to return the pt's call, mailbox is full, unable to lvm

## 2024-06-14 ENCOUNTER — Telehealth: Payer: Self-pay | Admitting: Orthopedic Surgery

## 2024-06-14 NOTE — Telephone Encounter (Signed)
 I called mail box is full.

## 2024-06-14 NOTE — Telephone Encounter (Signed)
 We sent him to Dr Georgina I will call him to see what he is asking for.

## 2024-06-14 NOTE — Telephone Encounter (Signed)
 DR. MARGRETTE   Patient came in the office and he wants Dr. MARGRETTE to send him to another doctor for a 2nd opinion.    Please call him back at [phone]

## 2024-06-14 NOTE — Telephone Encounter (Signed)
 He said he got a diagnosis from Dr Georgina Wants to see another doctor to see if the diagnosis is the same He will let me know where he wants to go / he will call back

## 2024-06-14 NOTE — Telephone Encounter (Signed)
 Pt lvm stating he got a call and it disconnected

## 2024-06-23 ENCOUNTER — Other Ambulatory Visit: Payer: Self-pay | Admitting: Surgery

## 2024-07-19 ENCOUNTER — Ambulatory Visit: Admitting: Orthopedic Surgery

## 2024-08-21 ENCOUNTER — Encounter: Payer: Self-pay | Admitting: Radiology

## 2024-08-30 ENCOUNTER — Ambulatory Visit: Admitting: Orthopedic Surgery
